# Patient Record
Sex: Male | Born: 1935 | Race: White | Hispanic: No | Smoking: Never smoker
Health system: Southern US, Community
[De-identification: ages and names within clinical notes are randomized; demographics above are authoritative.]

## PROBLEM LIST (undated history)

## (undated) DIAGNOSIS — C801 Malignant (primary) neoplasm, unspecified: Secondary | ICD-10-CM

## (undated) HISTORY — PX: HERNIA REPAIR: SHX51

---

## 2002-09-29 HISTORY — PX: BACK SURGERY: SHX140

## 2004-07-16 ENCOUNTER — Encounter: Admission: RE | Admit: 2004-07-16 | Discharge: 2004-07-16 | Payer: Self-pay | Admitting: Neurosurgery

## 2004-08-05 ENCOUNTER — Encounter: Admission: RE | Admit: 2004-08-05 | Discharge: 2004-08-05 | Payer: Self-pay | Admitting: Neurosurgery

## 2004-08-20 ENCOUNTER — Encounter: Admission: RE | Admit: 2004-08-20 | Discharge: 2004-08-20 | Payer: Self-pay | Admitting: Neurosurgery

## 2004-09-03 ENCOUNTER — Encounter: Admission: RE | Admit: 2004-09-03 | Discharge: 2004-09-03 | Payer: Self-pay | Admitting: Neurosurgery

## 2007-04-20 ENCOUNTER — Ambulatory Visit: Payer: Self-pay | Admitting: General Surgery

## 2007-05-07 ENCOUNTER — Emergency Department: Payer: Self-pay

## 2007-06-23 ENCOUNTER — Ambulatory Visit: Payer: Self-pay | Admitting: Gastroenterology

## 2008-09-29 DIAGNOSIS — C801 Malignant (primary) neoplasm, unspecified: Secondary | ICD-10-CM

## 2008-09-29 HISTORY — DX: Malignant (primary) neoplasm, unspecified: C80.1

## 2009-06-29 ENCOUNTER — Ambulatory Visit: Payer: Self-pay | Admitting: Internal Medicine

## 2009-07-26 ENCOUNTER — Emergency Department: Payer: Self-pay

## 2009-07-26 ENCOUNTER — Inpatient Hospital Stay: Payer: Self-pay | Admitting: *Deleted

## 2009-07-30 ENCOUNTER — Ambulatory Visit: Payer: Self-pay | Admitting: Internal Medicine

## 2009-07-31 ENCOUNTER — Ambulatory Visit: Payer: Self-pay | Admitting: Internal Medicine

## 2009-08-03 ENCOUNTER — Ambulatory Visit: Payer: Self-pay | Admitting: Internal Medicine

## 2009-08-06 ENCOUNTER — Ambulatory Visit: Payer: Self-pay | Admitting: Vascular Surgery

## 2009-08-14 ENCOUNTER — Ambulatory Visit: Payer: Self-pay | Admitting: Vascular Surgery

## 2009-08-29 ENCOUNTER — Ambulatory Visit: Payer: Self-pay | Admitting: Internal Medicine

## 2009-09-29 ENCOUNTER — Ambulatory Visit: Payer: Self-pay | Admitting: Internal Medicine

## 2009-10-30 ENCOUNTER — Ambulatory Visit: Payer: Self-pay | Admitting: Internal Medicine

## 2009-11-27 ENCOUNTER — Ambulatory Visit: Payer: Self-pay | Admitting: Internal Medicine

## 2009-12-25 ENCOUNTER — Ambulatory Visit: Payer: Self-pay | Admitting: Internal Medicine

## 2009-12-28 ENCOUNTER — Ambulatory Visit: Payer: Self-pay | Admitting: Internal Medicine

## 2010-01-27 ENCOUNTER — Ambulatory Visit: Payer: Self-pay | Admitting: Internal Medicine

## 2010-02-01 ENCOUNTER — Ambulatory Visit: Payer: Self-pay | Admitting: Internal Medicine

## 2010-02-27 ENCOUNTER — Ambulatory Visit: Payer: Self-pay | Admitting: Internal Medicine

## 2010-03-29 ENCOUNTER — Ambulatory Visit: Payer: Self-pay | Admitting: Internal Medicine

## 2010-03-29 HISTORY — PX: COLON SURGERY: SHX602

## 2010-04-18 LAB — CEA: CEA: 2.2 ng/mL (ref 0.0–4.7)

## 2010-04-29 ENCOUNTER — Ambulatory Visit: Payer: Self-pay | Admitting: Internal Medicine

## 2010-05-10 ENCOUNTER — Ambulatory Visit: Payer: Self-pay | Admitting: Internal Medicine

## 2010-05-16 LAB — CEA: CEA: 2.6 ng/mL (ref 0.0–4.7)

## 2010-05-21 ENCOUNTER — Inpatient Hospital Stay: Payer: Self-pay | Admitting: Internal Medicine

## 2010-05-30 ENCOUNTER — Ambulatory Visit: Payer: Self-pay | Admitting: Internal Medicine

## 2010-06-29 ENCOUNTER — Ambulatory Visit: Payer: Self-pay | Admitting: Internal Medicine

## 2010-07-12 LAB — CEA: CEA: 2.6 ng/mL (ref 0.0–4.7)

## 2010-07-19 LAB — CEA: CEA: 2.1 ng/mL (ref 0.0–4.7)

## 2010-07-30 ENCOUNTER — Ambulatory Visit: Payer: Self-pay | Admitting: Internal Medicine

## 2010-08-24 LAB — CEA: CEA: 1.1 ng/mL (ref 0.0–4.7)

## 2010-08-29 ENCOUNTER — Ambulatory Visit: Payer: Self-pay | Admitting: Internal Medicine

## 2010-09-29 ENCOUNTER — Ambulatory Visit: Payer: Self-pay | Admitting: Internal Medicine

## 2010-10-07 ENCOUNTER — Ambulatory Visit: Payer: Self-pay | Admitting: Gastroenterology

## 2010-10-09 LAB — PATHOLOGY REPORT

## 2010-10-30 ENCOUNTER — Ambulatory Visit: Payer: Self-pay | Admitting: Internal Medicine

## 2010-10-31 LAB — CEA: CEA: 1.6 ng/mL (ref 0.0–4.7)

## 2010-11-05 ENCOUNTER — Ambulatory Visit: Payer: Self-pay | Admitting: Internal Medicine

## 2010-11-28 ENCOUNTER — Ambulatory Visit: Payer: Self-pay | Admitting: Internal Medicine

## 2010-12-29 ENCOUNTER — Ambulatory Visit: Payer: Self-pay | Admitting: Internal Medicine

## 2011-01-06 ENCOUNTER — Ambulatory Visit: Payer: Self-pay | Admitting: Gastroenterology

## 2011-01-08 LAB — PATHOLOGY REPORT

## 2011-01-14 ENCOUNTER — Ambulatory Visit: Payer: Self-pay | Admitting: Internal Medicine

## 2011-01-28 ENCOUNTER — Ambulatory Visit: Payer: Self-pay | Admitting: Internal Medicine

## 2011-02-27 LAB — CEA: CEA: 1.9 ng/mL (ref 0.0–4.7)

## 2011-02-28 ENCOUNTER — Ambulatory Visit: Payer: Self-pay | Admitting: Internal Medicine

## 2011-03-30 ENCOUNTER — Ambulatory Visit: Payer: Self-pay | Admitting: Internal Medicine

## 2011-04-30 ENCOUNTER — Ambulatory Visit: Payer: Self-pay | Admitting: Internal Medicine

## 2011-04-30 HISTORY — PX: COLON SURGERY: SHX602

## 2011-05-15 LAB — CEA: CEA: 2.8 ng/mL (ref 0.0–4.7)

## 2011-05-31 ENCOUNTER — Ambulatory Visit: Payer: Self-pay | Admitting: Internal Medicine

## 2011-06-30 ENCOUNTER — Ambulatory Visit: Payer: Self-pay | Admitting: Internal Medicine

## 2011-07-31 ENCOUNTER — Ambulatory Visit: Payer: Self-pay | Admitting: Internal Medicine

## 2011-08-07 ENCOUNTER — Ambulatory Visit: Payer: Self-pay | Admitting: Surgery

## 2011-08-12 ENCOUNTER — Ambulatory Visit: Payer: Self-pay | Admitting: Internal Medicine

## 2011-08-13 ENCOUNTER — Ambulatory Visit: Payer: Self-pay | Admitting: Surgery

## 2011-08-13 LAB — CEA: CEA: 7.4 ng/mL — ABNORMAL HIGH (ref 0.0–4.7)

## 2011-08-30 ENCOUNTER — Ambulatory Visit: Payer: Self-pay | Admitting: Internal Medicine

## 2011-09-11 ENCOUNTER — Ambulatory Visit: Payer: Self-pay | Admitting: Internal Medicine

## 2011-09-12 ENCOUNTER — Ambulatory Visit: Payer: Self-pay | Admitting: Internal Medicine

## 2011-09-30 ENCOUNTER — Ambulatory Visit: Payer: Self-pay | Admitting: Internal Medicine

## 2011-10-07 LAB — CBC CANCER CENTER
Basophil %: 0.4 %
Basophil: 1 %
Eosinophil #: 0.1 x10 3/mm (ref 0.0–0.7)
Eosinophil %: 3.3 %
Eosinophil: 2 %
HCT: 39.9 % — ABNORMAL LOW (ref 40.0–52.0)
HGB: 13.7 g/dL (ref 13.0–18.0)
Lymphocyte #: 2 x10 3/mm (ref 1.0–3.6)
MCH: 31.7 pg (ref 26.0–34.0)
MCHC: 34.2 g/dL (ref 32.0–36.0)
MCV: 93 fL (ref 80–100)
Monocyte #: 0.9 x10 3/mm — ABNORMAL HIGH (ref 0.0–0.7)
Monocyte %: 24.4 %
Platelet: 136 x10 3/mm — ABNORMAL LOW (ref 150–440)
RBC: 4.3 10*6/uL — ABNORMAL LOW (ref 4.40–5.90)

## 2011-10-07 LAB — BASIC METABOLIC PANEL
Anion Gap: 8 (ref 7–16)
Calcium, Total: 8.6 mg/dL (ref 8.5–10.1)
Creatinine: 1.1 mg/dL (ref 0.60–1.30)
EGFR (African American): >60
EGFR (Non-African Amer.): >60
Glucose: 87 mg/dL (ref 65–99)
Osmolality: 283 (ref 275–301)
Potassium: 3.9 mmol/L (ref 3.5–5.1)

## 2011-10-08 LAB — CEA: CEA: 10.3 ng/mL — ABNORMAL HIGH (ref 0.0–4.7)

## 2011-10-14 LAB — CBC CANCER CENTER
Basophil #: 0 x10 3/mm (ref 0.0–0.1)
Basophil %: 0.5 %
HGB: 14.9 g/dL (ref 13.0–18.0)
Lymphocyte #: 1.9 x10 3/mm (ref 1.0–3.6)
MCH: 31.1 pg (ref 26.0–34.0)
MCV: 94 fL (ref 80–100)
Monocyte #: 0.6 x10 3/mm (ref 0.0–0.7)
Neutrophil #: 2.1 x10 3/mm (ref 1.4–6.5)
Platelet: 116 x10 3/mm — ABNORMAL LOW (ref 150–440)
RDW: 16.6 % — ABNORMAL HIGH (ref 11.5–14.5)
WBC: 4.7 x10 3/mm (ref 3.8–10.6)

## 2011-10-14 LAB — BASIC METABOLIC PANEL
Anion Gap: 11 (ref 7–16)
BUN: 9 mg/dL (ref 7–18)
Calcium, Total: 9.1 mg/dL (ref 8.5–10.1)
Chloride: 103 mmol/L (ref 98–107)
Co2: 29 mmol/L (ref 21–32)
EGFR (Non-African Amer.): >60
Glucose: 132 mg/dL — ABNORMAL HIGH (ref 65–99)
Osmolality: 286 (ref 275–301)
Potassium: 3.8 mmol/L (ref 3.5–5.1)

## 2011-10-21 LAB — CBC CANCER CENTER
Basophil #: 0 x10 3/mm (ref 0.0–0.1)
Basophil %: 0.4 %
Eosinophil #: 0.1 x10 3/mm (ref 0.0–0.7)
Eosinophil %: 1.7 %
HCT: 44.5 % (ref 40.0–52.0)
Lymphocyte %: 43.4 %
MCH: 31.9 pg (ref 26.0–34.0)
MCV: 93 fL (ref 80–100)
Monocyte %: 5.1 %
Neutrophil #: 1.6 x10 3/mm (ref 1.4–6.5)
Platelet: 78 x10 3/mm — ABNORMAL LOW (ref 150–440)
RBC: 4.79 10*6/uL (ref 4.40–5.90)
RDW: 15.6 % — ABNORMAL HIGH (ref 11.5–14.5)

## 2011-10-28 LAB — CBC CANCER CENTER
Basophil #: 0 x10 3/mm (ref 0.0–0.1)
Eosinophil #: 0.1 x10 3/mm (ref 0.0–0.7)
HCT: 42.3 % (ref 40.0–52.0)
Lymphocyte #: 1.7 x10 3/mm (ref 1.0–3.6)
Lymphocyte %: 47 %
MCHC: 34.5 g/dL (ref 32.0–36.0)
MCV: 93 fL (ref 80–100)
Neutrophil #: 1.4 x10 3/mm (ref 1.4–6.5)
Neutrophil %: 39 %
RDW: 16.2 % — ABNORMAL HIGH (ref 11.5–14.5)
WBC: 3.5 x10 3/mm — ABNORMAL LOW (ref 3.8–10.6)

## 2011-10-28 LAB — BASIC METABOLIC PANEL
Anion Gap: 10 (ref 7–16)
BUN: 8 mg/dL (ref 7–18)
Calcium, Total: 8.6 mg/dL (ref 8.5–10.1)
Chloride: 105 mmol/L (ref 98–107)
Co2: 27 mmol/L (ref 21–32)
Creatinine: 1 mg/dL (ref 0.60–1.30)
EGFR (African American): >60
Potassium: 4 mmol/L (ref 3.5–5.1)
Sodium: 142 mmol/L (ref 136–145)

## 2011-10-31 ENCOUNTER — Ambulatory Visit: Payer: Self-pay | Admitting: Internal Medicine

## 2011-11-04 LAB — CBC CANCER CENTER
Basophil #: 0 x10 3/mm (ref 0.0–0.1)
Eosinophil #: 0 x10 3/mm (ref 0.0–0.7)
Eosinophil %: 1.3 %
MCHC: 34.8 g/dL (ref 32.0–36.0)
MCV: 93 fL (ref 80–100)
Monocyte #: 0.3 x10 3/mm (ref 0.0–0.7)
Monocyte %: 10.3 %
Neutrophil #: 0.7 x10 3/mm — ABNORMAL LOW (ref 1.4–6.5)
Neutrophil %: 26.9 %
Platelet: 96 x10 3/mm — ABNORMAL LOW (ref 150–440)
RBC: 4.8 10*6/uL (ref 4.40–5.90)
WBC: 2.6 x10 3/mm — ABNORMAL LOW (ref 3.8–10.6)

## 2011-11-11 LAB — BASIC METABOLIC PANEL
Anion Gap: 8 (ref 7–16)
BUN: 10 mg/dL (ref 7–18)
Calcium, Total: 9.2 mg/dL (ref 8.5–10.1)
Chloride: 104 mmol/L (ref 98–107)
Co2: 29 mmol/L (ref 21–32)
EGFR (Non-African Amer.): >60
Osmolality: 282 (ref 275–301)
Potassium: 4.1 mmol/L (ref 3.5–5.1)

## 2011-11-11 LAB — CBC CANCER CENTER
Basophil %: 0.3 %
Eosinophil %: 0.9 %
HCT: 43.9 % (ref 40.0–52.0)
HGB: 15.3 g/dL (ref 13.0–18.0)
Lymphocyte #: 1.9 x10 3/mm (ref 1.0–3.6)
MCH: 33 pg (ref 26.0–34.0)
MCHC: 34.7 g/dL (ref 32.0–36.0)
MCV: 95 fL (ref 80–100)
Monocyte #: 0.5 x10 3/mm (ref 0.0–0.7)
Monocyte %: 14.1 %
Neutrophil #: 0.9 x10 3/mm — ABNORMAL LOW (ref 1.4–6.5)
RBC: 4.62 10*6/uL (ref 4.40–5.90)

## 2011-11-18 LAB — CBC CANCER CENTER
Basophil %: 0.8 %
Eosinophil %: 1.4 %
HCT: 43.6 % (ref 40.0–52.0)
Lymphocyte #: 1.8 x10 3/mm (ref 1.0–3.6)
MCH: 33.2 pg (ref 26.0–34.0)
MCV: 96 fL (ref 80–100)
Monocyte #: 0.4 x10 3/mm (ref 0.0–0.7)
Neutrophil #: 0.4 x10 3/mm — ABNORMAL LOW (ref 1.4–6.5)
RBC: 4.54 10*6/uL (ref 4.40–5.90)
WBC: 2.7 x10 3/mm — ABNORMAL LOW (ref 3.8–10.6)

## 2011-11-18 LAB — BASIC METABOLIC PANEL
Anion Gap: 9 (ref 7–16)
BUN: 10 mg/dL (ref 7–18)
Calcium, Total: 8.7 mg/dL (ref 8.5–10.1)
Chloride: 103 mmol/L (ref 98–107)
EGFR (African American): >60
EGFR (Non-African Amer.): >60
Glucose: 123 mg/dL — ABNORMAL HIGH (ref 65–99)
Osmolality: 284 (ref 275–301)
Potassium: 4 mmol/L (ref 3.5–5.1)

## 2011-11-18 LAB — MAGNESIUM: Magnesium: 1 mg/dL — ABNORMAL LOW

## 2011-11-28 ENCOUNTER — Ambulatory Visit: Payer: Self-pay | Admitting: Internal Medicine

## 2011-12-02 LAB — COMPREHENSIVE METABOLIC PANEL
Albumin: 3.9 g/dL (ref 3.4–5.0)
Alkaline Phosphatase: 122 U/L (ref 50–136)
Anion Gap: 10 (ref 7–16)
BUN: 12 mg/dL (ref 7–18)
Creatinine: 0.96 mg/dL (ref 0.60–1.30)
Glucose: 107 mg/dL — ABNORMAL HIGH (ref 65–99)
Osmolality: 285 (ref 275–301)
Potassium: 4.3 mmol/L (ref 3.5–5.1)
Sodium: 143 mmol/L (ref 136–145)
Total Protein: 7.4 g/dL (ref 6.4–8.2)

## 2011-12-02 LAB — CBC CANCER CENTER
Basophil %: 0.4 %
Eosinophil #: 0 x10 3/mm (ref 0.0–0.7)
HCT: 45.3 % (ref 40.0–52.0)
HGB: 15.4 g/dL (ref 13.0–18.0)
Lymphocyte %: 23.2 %
MCH: 33.2 pg (ref 26.0–34.0)
MCHC: 34 g/dL (ref 32.0–36.0)
MCV: 98 fL (ref 80–100)
Monocyte #: 0.4 x10 3/mm (ref 0.0–0.7)
Monocyte %: 5.5 %
Neutrophil #: 4.8 x10 3/mm (ref 1.4–6.5)

## 2011-12-09 LAB — BASIC METABOLIC PANEL
Anion Gap: 11 (ref 7–16)
BUN: 14 mg/dL (ref 7–18)
Chloride: 103 mmol/L (ref 98–107)
Co2: 28 mmol/L (ref 21–32)
Creatinine: 1.02 mg/dL (ref 0.60–1.30)
EGFR (African American): >60
Osmolality: 284 (ref 275–301)
Sodium: 142 mmol/L (ref 136–145)

## 2011-12-09 LAB — CBC CANCER CENTER
Eosinophil %: 1.2 %
HGB: 14.5 g/dL (ref 13.0–18.0)
Lymphocyte %: 37.2 %
Monocyte #: 0.3 x10 3/mm (ref 0.0–0.7)
Monocyte %: 7 %
Neutrophil %: 54.3 %
Platelet: 106 x10 3/mm — ABNORMAL LOW (ref 150–440)
RDW: 18.8 % — ABNORMAL HIGH (ref 11.5–14.5)
WBC: 4.1 x10 3/mm (ref 3.8–10.6)

## 2011-12-09 LAB — MAGNESIUM: Magnesium: 1 mg/dL — ABNORMAL LOW

## 2011-12-16 LAB — CBC CANCER CENTER
Eosinophil %: 2.2 %
HGB: 14.2 g/dL (ref 13.0–18.0)
Lymphocyte #: 1.4 x10 3/mm (ref 1.0–3.6)
Lymphocyte %: 46.5 %
MCH: 34.3 pg — ABNORMAL HIGH (ref 26.0–34.0)
Monocyte %: 16.6 %
Neutrophil #: 1 x10 3/mm — ABNORMAL LOW (ref 1.4–6.5)
RBC: 4.13 10*6/uL — ABNORMAL LOW (ref 4.40–5.90)
RDW: 18.8 % — ABNORMAL HIGH (ref 11.5–14.5)
WBC: 3 x10 3/mm — ABNORMAL LOW (ref 3.8–10.6)

## 2011-12-16 LAB — MAGNESIUM: Magnesium: 1 mg/dL — ABNORMAL LOW

## 2011-12-17 LAB — CEA: CEA: 3.5 ng/mL (ref 0.0–4.7)

## 2011-12-23 LAB — BASIC METABOLIC PANEL
Anion Gap: 9 (ref 7–16)
BUN: 12 mg/dL (ref 7–18)
Calcium, Total: 8.6 mg/dL (ref 8.5–10.1)
Chloride: 104 mmol/L (ref 98–107)
EGFR (African American): >60
EGFR (Non-African Amer.): >60
Glucose: 128 mg/dL — ABNORMAL HIGH (ref 65–99)
Osmolality: 285 (ref 275–301)

## 2011-12-23 LAB — CBC CANCER CENTER
Basophil #: 0 x10 3/mm (ref 0.0–0.1)
Eosinophil %: 1.3 %
Lymphocyte #: 2.1 x10 3/mm (ref 1.0–3.6)
Lymphocyte %: 53 %
MCH: 34.1 pg — ABNORMAL HIGH (ref 26.0–34.0)
MCHC: 34.3 g/dL (ref 32.0–36.0)
Monocyte #: 0.7 x10 3/mm (ref 0.0–0.7)
Monocyte %: 17 %
Neutrophil %: 27.9 %
Platelet: 114 x10 3/mm — ABNORMAL LOW (ref 150–440)
RBC: 4.47 10*6/uL (ref 4.40–5.90)
RDW: 19.4 % — ABNORMAL HIGH (ref 11.5–14.5)

## 2011-12-23 LAB — MAGNESIUM: Magnesium: 0.7 mg/dL — ABNORMAL LOW

## 2011-12-29 ENCOUNTER — Ambulatory Visit: Payer: Self-pay | Admitting: Internal Medicine

## 2011-12-30 LAB — CBC CANCER CENTER
Basophil %: 0.3 %
Eosinophil %: 1 %
HCT: 42 % (ref 40.0–52.0)
Lymphocyte #: 1.3 x10 3/mm (ref 1.0–3.6)
Lymphocyte %: 38.3 %
MCH: 34.2 pg — ABNORMAL HIGH (ref 26.0–34.0)
MCV: 100 fL (ref 80–100)
Monocyte #: 0.2 x10 3/mm (ref 0.0–0.7)
Neutrophil %: 53.7 %
RBC: 4.22 10*6/uL — ABNORMAL LOW (ref 4.40–5.90)

## 2011-12-30 LAB — BASIC METABOLIC PANEL
Anion Gap: 13 (ref 7–16)
Chloride: 102 mmol/L (ref 98–107)
Co2: 26 mmol/L (ref 21–32)
EGFR (African American): >60
EGFR (Non-African Amer.): >60
Potassium: 4.5 mmol/L (ref 3.5–5.1)

## 2012-01-06 LAB — CBC CANCER CENTER
Basophil #: 0 x10 3/mm (ref 0.0–0.1)
Basophil %: 0.2 %
Eosinophil #: 0.1 x10 3/mm (ref 0.0–0.7)
Eosinophil %: 1.9 %
HCT: 41 % (ref 40.0–52.0)
HGB: 14.1 g/dL (ref 13.0–18.0)
Lymphocyte #: 1.6 x10 3/mm (ref 1.0–3.6)
Lymphocyte %: 49.9 %
MCH: 34.5 pg — ABNORMAL HIGH (ref 26.0–34.0)
MCV: 100 fL (ref 80–100)
Monocyte #: 0.5 x10 3/mm (ref 0.0–0.7)
Neutrophil #: 1.1 x10 3/mm — ABNORMAL LOW (ref 1.4–6.5)
Neutrophil %: 33.5 %
Platelet: 90 x10 3/mm — ABNORMAL LOW (ref 150–440)
RBC: 4.09 10*6/uL — ABNORMAL LOW (ref 4.40–5.90)
RDW: 17.4 % — ABNORMAL HIGH (ref 11.5–14.5)
WBC: 3.3 x10 3/mm — ABNORMAL LOW (ref 3.8–10.6)

## 2012-01-06 LAB — CREATININE, SERUM
Creatinine: 1.12 mg/dL (ref 0.60–1.30)
EGFR (Non-African Amer.): >60

## 2012-01-06 LAB — MAGNESIUM: Magnesium: 0.8 mg/dL — ABNORMAL LOW

## 2012-01-13 LAB — CBC CANCER CENTER
Basophil #: 0 x10 3/mm (ref 0.0–0.1)
Basophil %: 0.5 %
Eosinophil %: 1.7 %
HCT: 43.8 % (ref 40.0–52.0)
HGB: 14.6 g/dL (ref 13.0–18.0)
Lymphocyte #: 1.5 x10 3/mm (ref 1.0–3.6)
Lymphocyte %: 57.1 %
MCH: 34 pg (ref 26.0–34.0)
Monocyte %: 16.8 %
Neutrophil #: 0.6 x10 3/mm — ABNORMAL LOW (ref 1.4–6.5)
Platelet: 102 x10 3/mm — ABNORMAL LOW (ref 150–440)
RBC: 4.29 10*6/uL — ABNORMAL LOW (ref 4.40–5.90)
RDW: 17.8 % — ABNORMAL HIGH (ref 11.5–14.5)

## 2012-01-13 LAB — BASIC METABOLIC PANEL
Calcium, Total: 8.4 mg/dL — ABNORMAL LOW (ref 8.5–10.1)
Creatinine: 1.13 mg/dL (ref 0.60–1.30)
Glucose: 132 mg/dL — ABNORMAL HIGH (ref 65–99)

## 2012-01-20 LAB — CBC CANCER CENTER
Basophil %: 0.5 %
Eosinophil #: 0 x10 3/mm (ref 0.0–0.7)
Eosinophil %: 0.8 %
HGB: 15.2 g/dL (ref 13.0–18.0)
Lymphocyte #: 1.6 x10 3/mm (ref 1.0–3.6)
MCH: 34 pg (ref 26.0–34.0)
MCV: 103 fL — ABNORMAL HIGH (ref 80–100)
Monocyte %: 13 %
Neutrophil #: 1.3 x10 3/mm — ABNORMAL LOW (ref 1.4–6.5)
Neutrophil %: 38.9 %
Platelet: 103 x10 3/mm — ABNORMAL LOW (ref 150–440)
RBC: 4.48 10*6/uL (ref 4.40–5.90)
RDW: 17.4 % — ABNORMAL HIGH (ref 11.5–14.5)
WBC: 3.5 x10 3/mm — ABNORMAL LOW (ref 3.8–10.6)

## 2012-01-20 LAB — BASIC METABOLIC PANEL
Anion Gap: 10 (ref 7–16)
Calcium, Total: 8.4 mg/dL — ABNORMAL LOW (ref 8.5–10.1)
Co2: 30 mmol/L (ref 21–32)
EGFR (African American): >60
EGFR (Non-African Amer.): >60
Potassium: 4.3 mmol/L (ref 3.5–5.1)

## 2012-01-20 LAB — MAGNESIUM: Magnesium: 0.8 mg/dL — ABNORMAL LOW

## 2012-01-26 ENCOUNTER — Ambulatory Visit: Payer: Self-pay | Admitting: Oncology

## 2012-01-27 LAB — COMPREHENSIVE METABOLIC PANEL
Alkaline Phosphatase: 59 U/L (ref 50–136)
Bilirubin,Total: 0.5 mg/dL (ref 0.2–1.0)
Chloride: 106 mmol/L (ref 98–107)
Creatinine: 0.9 mg/dL (ref 0.60–1.30)
EGFR (African American): >60
Glucose: 104 mg/dL — ABNORMAL HIGH (ref 65–99)
Osmolality: 284 (ref 275–301)
Potassium: 4.5 mmol/L (ref 3.5–5.1)
SGPT (ALT): 41 U/L
Total Protein: 7.5 g/dL (ref 6.4–8.2)

## 2012-01-27 LAB — CBC CANCER CENTER
Eosinophil #: 0 x10 3/mm (ref 0.0–0.7)
HCT: 44.9 % (ref 40.0–52.0)
Lymphocyte #: 1.5 x10 3/mm (ref 1.0–3.6)
Lymphocyte %: 44.3 %
MCH: 34 pg (ref 26.0–34.0)
MCV: 103 fL — ABNORMAL HIGH (ref 80–100)

## 2012-01-27 LAB — MAGNESIUM: Magnesium: 0.7 mg/dL — ABNORMAL LOW

## 2012-01-28 ENCOUNTER — Ambulatory Visit: Payer: Self-pay | Admitting: Internal Medicine

## 2012-02-03 LAB — CBC CANCER CENTER
Basophil #: 0 x10 3/mm (ref 0.0–0.1)
Basophil %: 0.4 %
Eosinophil %: 1.2 %
HCT: 43.5 % (ref 40.0–52.0)
MCH: 34 pg (ref 26.0–34.0)
MCHC: 33.3 g/dL (ref 32.0–36.0)
MCV: 102 fL — ABNORMAL HIGH (ref 80–100)
Monocyte %: 12 %
Neutrophil #: 1.9 x10 3/mm (ref 1.4–6.5)
Neutrophil %: 47.3 %
Platelet: 129 x10 3/mm — ABNORMAL LOW (ref 150–440)
RBC: 4.26 10*6/uL — ABNORMAL LOW (ref 4.40–5.90)
RDW: 15 % — ABNORMAL HIGH (ref 11.5–14.5)
WBC: 4 x10 3/mm (ref 3.8–10.6)

## 2012-02-03 LAB — COMPREHENSIVE METABOLIC PANEL
Albumin: 3.7 g/dL (ref 3.4–5.0)
Anion Gap: 7 (ref 7–16)
BUN: 14 mg/dL (ref 7–18)
EGFR (African American): >60
EGFR (Non-African Amer.): >60
Glucose: 76 mg/dL (ref 65–99)
Osmolality: 286 (ref 275–301)
SGOT(AST): 30 U/L (ref 15–37)
Sodium: 144 mmol/L (ref 136–145)

## 2012-02-03 LAB — MAGNESIUM: Magnesium: 0.8 mg/dL — ABNORMAL LOW

## 2012-02-16 LAB — CBC CANCER CENTER
Basophil %: 0.3 %
Eosinophil #: 0 x10 3/mm (ref 0.0–0.7)
HGB: 13.7 g/dL (ref 13.0–18.0)
Lymphocyte %: 39.7 %
MCH: 33 pg (ref 26.0–34.0)
MCHC: 32.8 g/dL (ref 32.0–36.0)
MCV: 101 fL — ABNORMAL HIGH (ref 80–100)
Monocyte #: 0.7 x10 3/mm (ref 0.2–1.0)
Monocyte %: 9.3 %
Neutrophil #: 3.6 x10 3/mm (ref 1.4–6.5)
Platelet: 57 x10 3/mm — ABNORMAL LOW (ref 150–440)
RBC: 4.16 10*6/uL — ABNORMAL LOW (ref 4.40–5.90)
WBC: 7.2 x10 3/mm (ref 3.8–10.6)

## 2012-02-16 LAB — MAGNESIUM: Magnesium: 0.8 mg/dL — ABNORMAL LOW

## 2012-02-28 ENCOUNTER — Ambulatory Visit: Payer: Self-pay | Admitting: Internal Medicine

## 2012-03-01 LAB — CBC CANCER CENTER
Basophil #: 0 x10 3/mm (ref 0.0–0.1)
Eosinophil #: 0 x10 3/mm (ref 0.0–0.7)
Eosinophil %: 1 %
Lymphocyte #: 1.8 x10 3/mm (ref 1.0–3.6)
Lymphocyte %: 40.7 %
MCH: 33.8 pg (ref 26.0–34.0)
MCHC: 34 g/dL (ref 32.0–36.0)
MCV: 99 fL (ref 80–100)
Monocyte #: 0.3 x10 3/mm (ref 0.2–1.0)
Monocyte %: 7.3 %
Neutrophil %: 50.5 %
Platelet: 118 x10 3/mm — ABNORMAL LOW (ref 150–440)
RBC: 4.54 10*6/uL (ref 4.40–5.90)

## 2012-03-01 LAB — COMPREHENSIVE METABOLIC PANEL
Albumin: 4.2 g/dL (ref 3.4–5.0)
Anion Gap: 9 (ref 7–16)
Co2: 28 mmol/L (ref 21–32)
EGFR (Non-African Amer.): >60
Glucose: 126 mg/dL — ABNORMAL HIGH (ref 65–99)
Osmolality: 283 (ref 275–301)
Potassium: 4.3 mmol/L (ref 3.5–5.1)
SGOT(AST): 44 U/L — ABNORMAL HIGH (ref 15–37)
SGPT (ALT): 46 U/L
Total Protein: 7.6 g/dL (ref 6.4–8.2)

## 2012-03-02 LAB — CEA: CEA: 2.6 ng/mL (ref 0.0–4.7)

## 2012-03-15 LAB — CBC CANCER CENTER
Basophil #: 0 x10 3/mm (ref 0.0–0.1)
Basophil %: 0.1 %
HCT: 45.8 % (ref 40.0–52.0)
HGB: 15.7 g/dL (ref 13.0–18.0)
Lymphocyte #: 2.4 x10 3/mm (ref 1.0–3.6)
Lymphocyte %: 27.5 %
MCV: 98 fL (ref 80–100)
Monocyte #: 0.8 x10 3/mm (ref 0.2–1.0)
Monocyte %: 8.6 %
Neutrophil #: 5.7 x10 3/mm (ref 1.4–6.5)
RDW: 13.5 % (ref 11.5–14.5)

## 2012-03-15 LAB — COMPREHENSIVE METABOLIC PANEL
Albumin: 4.1 g/dL (ref 3.4–5.0)
Alkaline Phosphatase: 64 U/L (ref 50–136)
BUN: 14 mg/dL (ref 7–18)
Calcium, Total: 9.2 mg/dL (ref 8.5–10.1)
Chloride: 104 mmol/L (ref 98–107)
SGOT(AST): 40 U/L — ABNORMAL HIGH (ref 15–37)
SGPT (ALT): 35 U/L
Sodium: 140 mmol/L (ref 136–145)
Total Protein: 7.7 g/dL (ref 6.4–8.2)

## 2012-03-15 LAB — MAGNESIUM: Magnesium: 0.9 mg/dL — ABNORMAL LOW

## 2012-03-29 ENCOUNTER — Ambulatory Visit: Payer: Self-pay | Admitting: Internal Medicine

## 2012-03-29 LAB — CBC CANCER CENTER
Basophil %: 0.3 %
HCT: 42.9 % (ref 40.0–52.0)
MCHC: 33.5 g/dL (ref 32.0–36.0)
MCV: 98 fL (ref 80–100)
Neutrophil %: 49.8 %
RDW: 14 % (ref 11.5–14.5)

## 2012-03-29 LAB — MAGNESIUM: Magnesium: 1.1 mg/dL — ABNORMAL LOW

## 2012-03-29 LAB — COMPREHENSIVE METABOLIC PANEL
Alkaline Phosphatase: 53 U/L (ref 50–136)
Anion Gap: 9 (ref 7–16)
BUN: 13 mg/dL (ref 7–18)
Bilirubin,Total: 0.5 mg/dL (ref 0.2–1.0)
Calcium, Total: 8.9 mg/dL (ref 8.5–10.1)
Chloride: 104 mmol/L (ref 98–107)
Co2: 29 mmol/L (ref 21–32)
Creatinine: 1.06 mg/dL (ref 0.60–1.30)
EGFR (Non-African Amer.): >60
Glucose: 91 mg/dL (ref 65–99)
Osmolality: 283 (ref 275–301)
Potassium: 4.7 mmol/L (ref 3.5–5.1)
Sodium: 142 mmol/L (ref 136–145)
Total Protein: 7.4 g/dL (ref 6.4–8.2)

## 2012-03-30 LAB — CEA: CEA: 3 ng/mL (ref 0.0–4.7)

## 2012-03-31 ENCOUNTER — Ambulatory Visit: Payer: Self-pay | Admitting: Surgery

## 2012-04-08 ENCOUNTER — Ambulatory Visit: Payer: Self-pay | Admitting: Surgery

## 2012-04-08 LAB — CBC
HCT: 42 % (ref 40.0–52.0)
HGB: 14.4 g/dL (ref 13.0–18.0)
MCH: 33.3 pg (ref 26.0–34.0)
MCV: 97 fL (ref 80–100)
RBC: 4.34 10*6/uL — ABNORMAL LOW (ref 4.40–5.90)
RDW: 14 % (ref 11.5–14.5)
WBC: 6.3 10*3/uL (ref 3.8–10.6)

## 2012-04-08 LAB — BASIC METABOLIC PANEL
BUN: 16 mg/dL (ref 7–18)
Calcium, Total: 8.8 mg/dL (ref 8.5–10.1)
EGFR (African American): >60
EGFR (Non-African Amer.): >60
Glucose: 85 mg/dL (ref 65–99)
Osmolality: 280 (ref 275–301)
Potassium: 5.5 mmol/L — ABNORMAL HIGH (ref 3.5–5.1)
Sodium: 140 mmol/L (ref 136–145)

## 2012-04-08 LAB — MAGNESIUM: Magnesium: 0.9 mg/dL — ABNORMAL LOW

## 2012-04-12 LAB — CBC CANCER CENTER
Basophil %: 0.2 %
Eosinophil #: 0 x10 3/mm (ref 0.0–0.7)
Eosinophil %: 0.6 %
HCT: 44.7 % (ref 40.0–52.0)
HGB: 14.8 g/dL (ref 13.0–18.0)
Lymphocyte %: 29.1 %
Monocyte #: 0.6 x10 3/mm (ref 0.2–1.0)
Monocyte %: 7.9 %
Neutrophil #: 4.5 x10 3/mm (ref 1.4–6.5)
Neutrophil %: 62.2 %
Platelet: 133 x10 3/mm — ABNORMAL LOW (ref 150–440)
RBC: 4.6 10*6/uL (ref 4.40–5.90)
WBC: 7.2 x10 3/mm (ref 3.8–10.6)

## 2012-04-12 LAB — COMPREHENSIVE METABOLIC PANEL
Alkaline Phosphatase: 59 U/L (ref 50–136)
BUN: 13 mg/dL (ref 7–18)
Bilirubin,Total: 0.5 mg/dL (ref 0.2–1.0)
Chloride: 104 mmol/L (ref 98–107)
Co2: 23 mmol/L (ref 21–32)
Creatinine: 1.19 mg/dL (ref 0.60–1.30)
EGFR (Non-African Amer.): 58 — ABNORMAL LOW
Glucose: 118 mg/dL — ABNORMAL HIGH (ref 65–99)
Osmolality: 281 (ref 275–301)
SGPT (ALT): 47 U/L
Sodium: 140 mmol/L (ref 136–145)
Total Protein: 7.5 g/dL (ref 6.4–8.2)

## 2012-04-12 LAB — MAGNESIUM: Magnesium: 1.3 mg/dL — ABNORMAL LOW

## 2012-04-13 LAB — CEA: CEA: 2.8 ng/mL (ref 0.0–4.7)

## 2012-04-16 ENCOUNTER — Inpatient Hospital Stay: Payer: Self-pay | Admitting: Surgery

## 2012-04-18 LAB — PLATELET COUNT: Platelet: 111 10*3/uL — ABNORMAL LOW (ref 150–440)

## 2012-04-26 LAB — CBC CANCER CENTER
Basophil #: 0 x10 3/mm (ref 0.0–0.1)
Basophil %: 0.6 %
Eosinophil #: 0.1 x10 3/mm (ref 0.0–0.7)
Lymphocyte %: 32.6 %
MCHC: 35 g/dL (ref 32.0–36.0)
Monocyte #: 0.8 x10 3/mm (ref 0.2–1.0)
Monocyte %: 11.2 %
Neutrophil #: 3.9 x10 3/mm (ref 1.4–6.5)
Neutrophil %: 54.6 %
RDW: 13.9 % (ref 11.5–14.5)

## 2012-04-26 LAB — COMPREHENSIVE METABOLIC PANEL
Alkaline Phosphatase: 74 U/L (ref 50–136)
BUN: 14 mg/dL (ref 7–18)
Bilirubin,Total: 0.5 mg/dL (ref 0.2–1.0)
Chloride: 102 mmol/L (ref 98–107)
Co2: 26 mmol/L (ref 21–32)
Creatinine: 1.2 mg/dL (ref 0.60–1.30)
EGFR (Non-African Amer.): 58 — ABNORMAL LOW
Glucose: 113 mg/dL — ABNORMAL HIGH (ref 65–99)
Osmolality: 275 (ref 275–301)
SGOT(AST): 35 U/L (ref 15–37)
Sodium: 137 mmol/L (ref 136–145)
Total Protein: 7.1 g/dL (ref 6.4–8.2)

## 2012-04-26 LAB — MAGNESIUM: Magnesium: 1 mg/dL — ABNORMAL LOW

## 2012-04-29 ENCOUNTER — Ambulatory Visit: Payer: Self-pay | Admitting: Oncology

## 2012-05-10 LAB — CBC CANCER CENTER
Basophil #: 0 x10 3/mm (ref 0.0–0.1)
Basophil %: 0.2 %
Eosinophil #: 0.1 x10 3/mm (ref 0.0–0.7)
HCT: 43.8 % (ref 40.0–52.0)
HGB: 15.3 g/dL (ref 13.0–18.0)
Lymphocyte #: 2.4 x10 3/mm (ref 1.0–3.6)
MCH: 33.2 pg (ref 26.0–34.0)
MCHC: 34.9 g/dL (ref 32.0–36.0)
MCV: 95 fL (ref 80–100)
Monocyte #: 0.6 x10 3/mm (ref 0.2–1.0)
Monocyte %: 7.7 %
Neutrophil #: 4.7 x10 3/mm (ref 1.4–6.5)
RBC: 4.59 10*6/uL (ref 4.40–5.90)
RDW: 14 % (ref 11.5–14.5)

## 2012-05-10 LAB — COMPREHENSIVE METABOLIC PANEL
Albumin: 4.1 g/dL (ref 3.4–5.0)
Anion Gap: 5 — ABNORMAL LOW (ref 7–16)
BUN: 17 mg/dL (ref 7–18)
Bilirubin,Total: 0.6 mg/dL (ref 0.2–1.0)
Calcium, Total: 8.9 mg/dL (ref 8.5–10.1)
Chloride: 104 mmol/L (ref 98–107)
Co2: 31 mmol/L (ref 21–32)
EGFR (African American): >60
SGOT(AST): 39 U/L — ABNORMAL HIGH (ref 15–37)
SGPT (ALT): 38 U/L (ref 12–78)
Sodium: 140 mmol/L (ref 136–145)
Total Protein: 7.6 g/dL (ref 6.4–8.2)

## 2012-05-10 LAB — MAGNESIUM: Magnesium: 0.9 mg/dL — ABNORMAL LOW

## 2012-05-24 LAB — CBC CANCER CENTER
Basophil %: 0.3 %
Eosinophil #: 0.1 x10 3/mm (ref 0.0–0.7)
HCT: 45.6 % (ref 40.0–52.0)
HGB: 15.1 g/dL (ref 13.0–18.0)
Lymphocyte %: 26.2 %
MCHC: 33.2 g/dL (ref 32.0–36.0)
MCV: 96 fL (ref 80–100)
Monocyte %: 7.9 %
Neutrophil #: 4.4 x10 3/mm (ref 1.4–6.5)
RDW: 14.4 % (ref 11.5–14.5)
WBC: 6.8 x10 3/mm (ref 3.8–10.6)

## 2012-05-24 LAB — COMPREHENSIVE METABOLIC PANEL
Alkaline Phosphatase: 68 U/L (ref 50–136)
Anion Gap: 6 — ABNORMAL LOW (ref 7–16)
BUN: 15 mg/dL (ref 7–18)
Bilirubin,Total: 0.5 mg/dL (ref 0.2–1.0)
Chloride: 103 mmol/L (ref 98–107)
Creatinine: 1.07 mg/dL (ref 0.60–1.30)
Glucose: 100 mg/dL — ABNORMAL HIGH (ref 65–99)
Osmolality: 280 (ref 275–301)
Potassium: 4.2 mmol/L (ref 3.5–5.1)
SGPT (ALT): 44 U/L (ref 12–78)
Sodium: 140 mmol/L (ref 136–145)
Total Protein: 7.8 g/dL (ref 6.4–8.2)

## 2012-05-24 LAB — MAGNESIUM: Magnesium: 0.9 mg/dL — ABNORMAL LOW

## 2012-05-25 LAB — CEA: CEA: 2.8 ng/mL (ref 0.0–4.7)

## 2012-05-30 ENCOUNTER — Ambulatory Visit: Payer: Self-pay | Admitting: Internal Medicine

## 2012-05-30 ENCOUNTER — Ambulatory Visit: Payer: Self-pay | Admitting: Oncology

## 2012-06-07 LAB — CBC CANCER CENTER
Basophil #: 0 x10 3/mm (ref 0.0–0.1)
Basophil %: 0.3 %
Eosinophil #: 0.1 x10 3/mm (ref 0.0–0.7)
HCT: 43.6 % (ref 40.0–52.0)
HGB: 14.4 g/dL (ref 13.0–18.0)
Lymphocyte #: 2.4 x10 3/mm (ref 1.0–3.6)
MCHC: 33.1 g/dL (ref 32.0–36.0)
MCV: 97 fL (ref 80–100)
Neutrophil #: 4 x10 3/mm (ref 1.4–6.5)
RDW: 14.5 % (ref 11.5–14.5)

## 2012-06-07 LAB — COMPREHENSIVE METABOLIC PANEL
Albumin: 4.2 g/dL (ref 3.4–5.0)
Anion Gap: 5 — ABNORMAL LOW (ref 7–16)
Bilirubin,Total: 0.7 mg/dL (ref 0.2–1.0)
Chloride: 105 mmol/L (ref 98–107)
Co2: 30 mmol/L (ref 21–32)
Creatinine: 1.06 mg/dL (ref 0.60–1.30)
EGFR (African American): >60
EGFR (Non-African Amer.): >60
Glucose: 104 mg/dL — ABNORMAL HIGH (ref 65–99)
Osmolality: 280 (ref 275–301)
Potassium: 4.2 mmol/L (ref 3.5–5.1)
SGOT(AST): 36 U/L (ref 15–37)
Sodium: 140 mmol/L (ref 136–145)

## 2012-06-07 LAB — MAGNESIUM: Magnesium: 0.8 mg/dL — ABNORMAL LOW

## 2012-06-21 LAB — CBC CANCER CENTER
Basophil #: 0 x10 3/mm (ref 0.0–0.1)
Eosinophil #: 0.1 x10 3/mm (ref 0.0–0.7)
Eosinophil %: 1.1 %
HGB: 15.4 g/dL (ref 13.0–18.0)
MCHC: 32.8 g/dL (ref 32.0–36.0)
MCV: 97 fL (ref 80–100)
Monocyte #: 0.4 x10 3/mm (ref 0.2–1.0)
Monocyte %: 8.7 %
Neutrophil %: 50.9 %
Platelet: 120 x10 3/mm — ABNORMAL LOW (ref 150–440)
RBC: 4.87 10*6/uL (ref 4.40–5.90)
WBC: 4.9 x10 3/mm (ref 3.8–10.6)

## 2012-06-21 LAB — COMPREHENSIVE METABOLIC PANEL
Albumin: 4.1 g/dL (ref 3.4–5.0)
Anion Gap: 6 — ABNORMAL LOW (ref 7–16)
BUN: 16 mg/dL (ref 7–18)
Bilirubin,Total: 0.6 mg/dL (ref 0.2–1.0)
Creatinine: 1.21 mg/dL (ref 0.60–1.30)
EGFR (African American): >60
Glucose: 133 mg/dL — ABNORMAL HIGH (ref 65–99)
Potassium: 3.8 mmol/L (ref 3.5–5.1)
Sodium: 139 mmol/L (ref 136–145)
Total Protein: 7.6 g/dL (ref 6.4–8.2)

## 2012-06-21 LAB — MAGNESIUM: Magnesium: 1 mg/dL — ABNORMAL LOW

## 2012-06-22 LAB — CEA: CEA: 3.8 ng/mL (ref 0.0–4.7)

## 2012-06-29 ENCOUNTER — Ambulatory Visit: Payer: Self-pay | Admitting: Oncology

## 2012-07-05 LAB — COMPREHENSIVE METABOLIC PANEL
Alkaline Phosphatase: 58 U/L (ref 50–136)
Anion Gap: 13 (ref 7–16)
BUN: 11 mg/dL (ref 7–18)
Calcium, Total: 8.7 mg/dL (ref 8.5–10.1)
Creatinine: 1.06 mg/dL (ref 0.60–1.30)
EGFR (Non-African Amer.): >60
Glucose: 127 mg/dL — ABNORMAL HIGH (ref 65–99)
Osmolality: 286 (ref 275–301)
SGPT (ALT): 46 U/L (ref 12–78)

## 2012-07-05 LAB — CBC CANCER CENTER
Basophil %: 0.4 %
Eosinophil #: 0.1 x10 3/mm (ref 0.0–0.7)
HGB: 15.1 g/dL (ref 13.0–18.0)
MCH: 31.8 pg (ref 26.0–34.0)
MCHC: 33.3 g/dL (ref 32.0–36.0)
MCV: 96 fL (ref 80–100)
Monocyte %: 6.4 %
Neutrophil #: 3.4 x10 3/mm (ref 1.4–6.5)
Neutrophil %: 58 %
RBC: 4.76 10*6/uL (ref 4.40–5.90)

## 2012-07-30 ENCOUNTER — Ambulatory Visit: Payer: Self-pay | Admitting: Oncology

## 2012-08-04 ENCOUNTER — Ambulatory Visit: Payer: Self-pay | Admitting: Oncology

## 2012-08-09 ENCOUNTER — Ambulatory Visit: Payer: Self-pay | Admitting: Oncology

## 2012-08-09 LAB — COMPREHENSIVE METABOLIC PANEL
Alkaline Phosphatase: 75 U/L (ref 50–136)
Anion Gap: 12 (ref 7–16)
BUN: 13 mg/dL (ref 7–18)
Calcium, Total: 8.6 mg/dL (ref 8.5–10.1)
Chloride: 104 mmol/L (ref 98–107)
Creatinine: 1.14 mg/dL (ref 0.60–1.30)
EGFR (African American): >60
EGFR (Non-African Amer.): >60
Glucose: 95 mg/dL (ref 65–99)
Osmolality: 287 (ref 275–301)
Potassium: 4 mmol/L (ref 3.5–5.1)
SGOT(AST): 30 U/L (ref 15–37)
SGPT (ALT): 38 U/L (ref 12–78)

## 2012-08-09 LAB — CBC CANCER CENTER
Basophil #: 0 10*3/uL
Basophil %: 0.4 %
Eosinophil #: 0.1 10*3/uL
Eosinophil %: 1.4 %
HCT: 45.5 %
HGB: 14.9 g/dL
Lymphocyte %: 30.8 %
Lymphs Abs: 1.9 10*3/uL
MCH: 31.4 pg
MCHC: 32.8 g/dL
MCV: 96 fL
Monocyte #: 0.6 10*3/uL
Monocyte %: 10 %
Neutrophil #: 3.6 10*3/uL
Neutrophil %: 57.4 %
Platelet: 152 10*3/uL
RBC: 4.75 10*6/uL
RDW: 14.3 %
WBC: 6.3 10*3/uL

## 2012-08-09 LAB — MAGNESIUM: Magnesium: 0.8 mg/dL — ABNORMAL LOW

## 2012-08-10 LAB — CEA: CEA: 4 ng/mL (ref 0.0–4.7)

## 2012-08-23 LAB — CBC CANCER CENTER
Basophil %: 0.5 %
Lymphocyte #: 1.8 x10 3/mm (ref 1.0–3.6)
Lymphocyte %: 33.1 %
MCV: 96 fL (ref 80–100)
Monocyte #: 0.4 x10 3/mm (ref 0.2–1.0)
Monocyte %: 7.9 %
Neutrophil %: 57 %
Platelet: 142 x10 3/mm — ABNORMAL LOW (ref 150–440)
RDW: 14.1 % (ref 11.5–14.5)
WBC: 5.4 x10 3/mm (ref 3.8–10.6)

## 2012-08-23 LAB — COMPREHENSIVE METABOLIC PANEL
Anion Gap: 11 (ref 7–16)
Calcium, Total: 8.6 mg/dL (ref 8.5–10.1)
Chloride: 102 mmol/L (ref 98–107)
Co2: 27 mmol/L (ref 21–32)
EGFR (African American): >60
EGFR (Non-African Amer.): >60
Glucose: 121 mg/dL — ABNORMAL HIGH (ref 65–99)
Osmolality: 281 (ref 275–301)
Potassium: 4.1 mmol/L (ref 3.5–5.1)
Sodium: 140 mmol/L (ref 136–145)

## 2012-08-24 LAB — CEA: CEA: 3.9 ng/mL (ref 0.0–4.7)

## 2012-08-29 ENCOUNTER — Ambulatory Visit: Payer: Self-pay | Admitting: Internal Medicine

## 2012-09-01 ENCOUNTER — Other Ambulatory Visit: Payer: Self-pay | Admitting: Oncology

## 2012-09-01 DIAGNOSIS — R918 Other nonspecific abnormal finding of lung field: Secondary | ICD-10-CM

## 2012-09-06 LAB — COMPREHENSIVE METABOLIC PANEL
Albumin: 3.8 g/dL (ref 3.4–5.0)
Anion Gap: 12 (ref 7–16)
BUN: 12 mg/dL (ref 7–18)
Bilirubin,Total: 0.6 mg/dL (ref 0.2–1.0)
Chloride: 103 mmol/L (ref 98–107)
Co2: 26 mmol/L (ref 21–32)
Creatinine: 1.25 mg/dL (ref 0.60–1.30)
EGFR (African American): >60
EGFR (Non-African Amer.): 54 — ABNORMAL LOW
Osmolality: 283 (ref 275–301)
Potassium: 3.9 mmol/L (ref 3.5–5.1)
SGOT(AST): 29 U/L (ref 15–37)
Sodium: 141 mmol/L (ref 136–145)
Total Protein: 6.9 g/dL (ref 6.4–8.2)

## 2012-09-06 LAB — CBC CANCER CENTER
Basophil #: 0 x10 3/mm (ref 0.0–0.1)
Basophil %: 0.5 %
HGB: 14 g/dL (ref 13.0–18.0)
Lymphocyte %: 38.3 %
MCV: 95 fL (ref 80–100)
Monocyte %: 7.4 %
Neutrophil %: 52.1 %
Platelet: 120 x10 3/mm — ABNORMAL LOW (ref 150–440)
RDW: 14.7 % — ABNORMAL HIGH (ref 11.5–14.5)
WBC: 6 x10 3/mm (ref 3.8–10.6)

## 2012-09-09 ENCOUNTER — Ambulatory Visit
Admission: RE | Admit: 2012-09-09 | Discharge: 2012-09-09 | Disposition: A | Discharge status: Home / Self Care | Payer: Medicare Other | Source: Ambulatory Visit | Attending: Oncology | Admitting: Oncology

## 2012-09-09 DIAGNOSIS — R918 Other nonspecific abnormal finding of lung field: Secondary | ICD-10-CM

## 2012-09-09 HISTORY — DX: Malignant (primary) neoplasm, unspecified: C80.1

## 2012-09-14 NOTE — Progress Notes (Signed)
Patient ID: Edward Bradley, male   DOB: 07-06-1931, 78 y.o.   MRN: [id number]  NEW PATIENT OFFICE VISIT  September 09, 2012  [name], M.D. [facility] [address] [place], [place] [address]  RE: Edward Bradley (DOB: 1931-05-21)  Dear Dr. [name]:  Thank you for sending Mr. Edward Bradley for consultation regarding possible use of percutaneous ablation to treat metastatic pulmonary lesions. The patient is a 78 year old male with a history of stage IV rectal carcinoma originally diagnosed three years ago and is status post surgical resection and chemotherapy. The patient initially presented with multiple hepatic metastases, pulmonary nodules and positive lymph nodes with a CEA level of 14.3. He was treated with six cycles of FOLFOX and Cetuximab with significant response. Hepatic metastatic disease resolved by imaging and CEA dropped to 1.1. A colostomy take down failed due to rectal stricture and the patient has a chronic left sided colostomy.  Due to rising CEA level up to 4.0, the patient was restaged with PET scan on 08/04/12 demonstrating a new hypermetabolic metastasis measuring 2 cm in the left lower lobe demonstrating an SUV max of 10.4 and a 1.1 cm right lower lobe pulmonary nodule demonstrating an SUV max of 2.5. There was no other evidence of recurrent or metastatic disease throughout the rest of the body.  The patient was referred to Dr. [name] for opinion regarding surgical resection of metastatic lesions. Based on bilateral lesions and the patient's age, he was not felt to be the best candidate for surgical resection.  Mr. [name] has been asymptomatic with respect to the pulmonary lesions and denies any hemoptysis, chest pain or significant shortness of breath. He was restarted on chemotherapy in November and is now on a regimen of Vectibix and Folfiri.  Past Medical History:  1. Metastatic colon cancer as above.  Recent chemotherapy has been complicated by hypomagnesemia and the patient is receiving magnesium supplements. 2. History of high cholesterol. The patient's primary care physician is Dr. [name]. 3. History of prior spinal surgery in 2004. 4. History of prior hernia repair.  Medications: Tamsulosin 0.4 mg daily, doxycycline 100 mg daily, Zofran 4 mg as needed for nausea, simvastatin 40 mg daily, CoQ10 50 mg daily, multivitamin daily, vitamin D3 supplement daily.  Allergies: Sulfa.  Social History: The patient is single and lives in [place], [place]. He has four children. He is not currently employed. He drinks wine with dinner. He denies tobacco use.  Family History: Father, brother and sister with history of asthma. Sister with history of breast cancer. Father with history of stroke. Multiple family members with history of diabetes.  Review of Systems: General: Chronic fatigue from chemotherapy. Ear, nose, throat: Periodic ringing in ears. Eyes: History of cataracts. Gastrointestinal: No vomiting or diarrhea. Occasional nausea related to chemotherapy. Genitourinary: History of enlarged prostate gland without prior prostate surgery or biopsy. No urinary symptoms. Cardiovascular: No chest pain or palpitations. Musculoskeletal: No significant joint or muscle pain. 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 Respiratory: Some mild dyspnea with exertion. No hemoptysis or cough. Skin: History of rash and skin lesions related to chemotherapy. The patient is on chronic doxycycline.  Exam: Vital Signs: Blood pressure 119/79, pulse 88, respirations 18, temperature 97.6, oxygen saturation 98 percent on room air. Height 5'6", 143 pounds. General: No acute distress. Chest: Clear to auscultation bilaterally. Heart: Regular rate and rhythm. Abdomen: Soft and nontender. Extremities: No edema.  Labs: BUN 13, creatinine 1.14, WBC  5.4, hemoglobin  14.9, hematocrit 44.6, platelet count 142, CEA 3.9 on 08/23/12.  Imaging: I personally reviewed the latest PET scan dated 08/04/12 on CD from [facility]. The 2 cm left lower lobe nodule is located in the posterior lower lobe with approximately 1 cm margin from the nearest posterior pleural surface. This lesion is considerably more hypermetabolic than the smaller 1.1 cm right lower lobe nodule which is located in the infrahilar region of the lower lobe immediately posterior and medial to a posterior basilar segment bronchus.  Assessment: I met with [name] and his significant other and reviewed the PET CT findings with them as well as treatment options. He is currently tolerating the latest chemotherapy regimen well without significant complications. He has not been reimaged since restarting chemotherapy. I reviewed percutaneous treatment options with the patient including percutaneous microwave ablation. We also discussed the possibility of future stereotactic radiation therapy. Both lesions are potentially amenable to future thermal ablation. The smaller right lower lobe lesion is located next to a larger bronchus compared to the left lower lobe lesion and may be slightly riskier to treat percutaneously. The left lower lobe lesion is clearly more metabolic by PET scan and currently is of size and location amenable to percutaneous ablation.  Given that the patient has just restarted chemotherapy for the last three weeks, I did recommend that we obtain at least one interval follow-up CT of the chest fairly soon to determine initial treatment response of chemotherapy. This will be helpful in timing potential future ablation and determining which lesion or lesions to ablate in the future. Future ablation would also have to be timed with cessation of chemotherapy for probably approximately 4 weeks prior to ablation.  Details of percutaneous microwave ablation  were reviewed with the patient including risks of pneumothorax, pulmonary hemorrhage and bronchopleural fistula. The procedure is performed under general anesthesia and involves an overnight hospital stay. After discussion, the patient is definitely interested in pursuing ablation in the future if it is appropriate and he does not develop any other significant interval metastatic disease.  I think that it would be helpful to obtain a restaging CT of the chest in approximately one month to determine whether the lesions are responding to therapy or not. In the meantime, any precertification necessary for pulmonary ablation will be started.  Thank you for sending Mr. Eslick for consultation and allowing me to participate in his care. He would like to have a follow-up scan performed at [facility].  Sincerely,  Irish Lack, M.D. Lavone NianHulda Marin, M.D. [name], M.D.

## 2012-09-20 LAB — CBC CANCER CENTER
Basophil %: 0.7 %
Eosinophil %: 2.2 %
HCT: 40.5 % (ref 40.0–52.0)
HGB: 14.3 g/dL (ref 13.0–18.0)
Lymphocyte #: 2.3 x10 3/mm (ref 1.0–3.6)
MCHC: 35.3 g/dL (ref 32.0–36.0)
Monocyte #: 0.7 x10 3/mm (ref 0.2–1.0)
Monocyte %: 10.5 %
Neutrophil #: 3.8 x10 3/mm (ref 1.4–6.5)
Neutrophil %: 54.1 %
Platelet: 111 x10 3/mm — ABNORMAL LOW (ref 150–440)
RDW: 15.9 % — ABNORMAL HIGH (ref 11.5–14.5)
WBC: 6.9 x10 3/mm (ref 3.8–10.6)

## 2012-09-20 LAB — COMPREHENSIVE METABOLIC PANEL
Alkaline Phosphatase: 68 U/L (ref 50–136)
Anion Gap: 12 (ref 7–16)
BUN: 16 mg/dL (ref 7–18)
Bilirubin,Total: 0.7 mg/dL (ref 0.2–1.0)
Chloride: 104 mmol/L (ref 98–107)
Co2: 26 mmol/L (ref 21–32)
Creatinine: 1.22 mg/dL (ref 0.60–1.30)
EGFR (African American): >60
EGFR (Non-African Amer.): 56 — ABNORMAL LOW
Osmolality: 285 (ref 275–301)
Potassium: 3.9 mmol/L (ref 3.5–5.1)
SGPT (ALT): 36 U/L (ref 12–78)
Total Protein: 7.3 g/dL (ref 6.4–8.2)

## 2012-09-20 LAB — MAGNESIUM: Magnesium: 0.5 mg/dL — ABNORMAL LOW

## 2012-09-29 ENCOUNTER — Ambulatory Visit: Payer: Self-pay | Admitting: Oncology

## 2012-10-04 LAB — COMPREHENSIVE METABOLIC PANEL
Albumin: 3.6 g/dL (ref 3.4–5.0)
Anion Gap: 14 (ref 7–16)
Calcium, Total: 8.3 mg/dL — ABNORMAL LOW (ref 8.5–10.1)
Chloride: 103 mmol/L (ref 98–107)
Co2: 25 mmol/L (ref 21–32)
Creatinine: 1.19 mg/dL (ref 0.60–1.30)
Glucose: 138 mg/dL — ABNORMAL HIGH (ref 65–99)
Potassium: 4 mmol/L (ref 3.5–5.1)
SGOT(AST): 36 U/L (ref 15–37)
SGPT (ALT): 35 U/L (ref 12–78)
Sodium: 142 mmol/L (ref 136–145)

## 2012-10-04 LAB — CBC CANCER CENTER
Basophil #: 0.1 x10 3/mm (ref 0.0–0.1)
Basophil %: 0.9 %
HCT: 38.2 % — ABNORMAL LOW (ref 40.0–52.0)
HGB: 13.5 g/dL (ref 13.0–18.0)
Lymphocyte %: 32 %
MCH: 34.1 pg — ABNORMAL HIGH (ref 26.0–34.0)
MCHC: 35.4 g/dL (ref 32.0–36.0)
MCV: 96 fL (ref 80–100)
Platelet: 124 x10 3/mm — ABNORMAL LOW (ref 150–440)
RBC: 3.97 10*6/uL — ABNORMAL LOW (ref 4.40–5.90)
RDW: 18.5 % — ABNORMAL HIGH (ref 11.5–14.5)

## 2012-10-05 LAB — CEA: CEA: 2.3 ng/mL (ref 0.0–4.7)

## 2012-10-12 ENCOUNTER — Telehealth: Payer: Self-pay | Admitting: Radiology

## 2012-10-12 NOTE — Telephone Encounter (Signed)
Error

## 2012-10-18 LAB — CBC CANCER CENTER
Basophil #: 0 x10 3/mm (ref 0.0–0.1)
Eosinophil %: 0 %
HGB: 13.7 g/dL (ref 13.0–18.0)
Lymphocyte #: 1.2 x10 3/mm (ref 1.0–3.6)
MCH: 34.2 pg — ABNORMAL HIGH (ref 26.0–34.0)
MCHC: 34.7 g/dL (ref 32.0–36.0)
MCV: 99 fL (ref 80–100)
Monocyte %: 1.9 %
Neutrophil %: 88 %
RBC: 4.01 10*6/uL — ABNORMAL LOW (ref 4.40–5.90)
RDW: 19.9 % — ABNORMAL HIGH (ref 11.5–14.5)
WBC: 11.9 x10 3/mm — ABNORMAL HIGH (ref 3.8–10.6)

## 2012-10-18 LAB — COMPREHENSIVE METABOLIC PANEL
Albumin: 3.4 g/dL (ref 3.4–5.0)
Alkaline Phosphatase: 81 U/L (ref 50–136)
Anion Gap: 14 (ref 7–16)
Calcium, Total: 9.2 mg/dL (ref 8.5–10.1)
Chloride: 102 mmol/L (ref 98–107)
EGFR (African American): >60
EGFR (Non-African Amer.): 53 — ABNORMAL LOW
Osmolality: 278 (ref 275–301)
SGPT (ALT): 37 U/L (ref 12–78)

## 2012-10-18 LAB — MAGNESIUM: Magnesium: 1.3 mg/dL — ABNORMAL LOW

## 2012-10-19 LAB — CEA: CEA: 2.5 ng/mL (ref 0.0–4.7)

## 2012-10-26 ENCOUNTER — Other Ambulatory Visit: Payer: Self-pay | Admitting: Interventional Radiology

## 2012-10-26 DIAGNOSIS — C20 Malignant neoplasm of rectum: Secondary | ICD-10-CM

## 2012-10-26 DIAGNOSIS — C7800 Secondary malignant neoplasm of unspecified lung: Secondary | ICD-10-CM

## 2012-10-30 ENCOUNTER — Ambulatory Visit: Payer: Self-pay | Admitting: Oncology

## 2012-10-30 ENCOUNTER — Ambulatory Visit: Payer: Self-pay | Admitting: Internal Medicine

## 2012-11-01 LAB — COMPREHENSIVE METABOLIC PANEL
Albumin: 3.6 g/dL (ref 3.4–5.0)
Anion Gap: 6 — ABNORMAL LOW (ref 7–16)
BUN: 16 mg/dL (ref 7–18)
Co2: 31 mmol/L (ref 21–32)
Glucose: 93 mg/dL (ref 65–99)
Osmolality: 278 (ref 275–301)
Potassium: 4.8 mmol/L (ref 3.5–5.1)
SGOT(AST): 22 U/L (ref 15–37)
SGPT (ALT): 41 U/L (ref 12–78)
Sodium: 139 mmol/L (ref 136–145)

## 2012-11-01 LAB — CBC CANCER CENTER
Basophil %: 0.8 %
Eosinophil #: 0.2 x10 3/mm (ref 0.0–0.7)
HCT: 40.5 % (ref 40.0–52.0)
HGB: 13.8 g/dL (ref 13.0–18.0)
Lymphocyte #: 2.5 x10 3/mm (ref 1.0–3.6)
Lymphocyte %: 31 %
MCH: 34.3 pg — ABNORMAL HIGH (ref 26.0–34.0)
MCV: 101 fL — ABNORMAL HIGH (ref 80–100)
Monocyte %: 11.3 %
Neutrophil %: 54.1 %
Platelet: 148 x10 3/mm — ABNORMAL LOW (ref 150–440)
RBC: 4.02 10*6/uL — ABNORMAL LOW (ref 4.40–5.90)
RDW: 18.8 % — ABNORMAL HIGH (ref 11.5–14.5)
WBC: 8.2 x10 3/mm (ref 3.8–10.6)

## 2012-11-01 LAB — MAGNESIUM: Magnesium: 1.6 mg/dL — ABNORMAL LOW

## 2012-11-03 ENCOUNTER — Ambulatory Visit
Admission: RE | Admit: 2012-11-03 | Discharge: 2012-11-03 | Disposition: A | Discharge status: Home / Self Care | Payer: Medicare Other | Source: Ambulatory Visit | Attending: Interventional Radiology | Admitting: Interventional Radiology

## 2012-11-03 DIAGNOSIS — C7800 Secondary malignant neoplasm of unspecified lung: Secondary | ICD-10-CM

## 2012-11-03 DIAGNOSIS — C20 Malignant neoplasm of rectum: Secondary | ICD-10-CM

## 2012-11-03 NOTE — Progress Notes (Signed)
Patient ID: Edward Bradley, male   DOB: [birth year], 79 y.o.   MRN: [id number]  ESTABLISHED PATIENT OFFICE VISIT  Chief Complaint: Follow-up of pulmonary metastatic lesions from rectal carcinoma.  History: The patient returns for followup after recent restaging CT imaging. He was initially seen on 09/09/2012 for possible percutaneous ablation of metastatic pulmonary nodules in the right lower lobe and left lower lobe. We elected at that time to reassess lesions after further chemotherapy. The patient remains on chemotherapy per Dr. [name]. CEA has fallen from approximately 4.5 to 2.0 in the last few months. The patient has been feeling well and has no significant symptoms.  Imaging: The most recent chest CT with contrast on 10/18/2012 was obtained by CD and reviewed. This is compared to a PET scan dated 08/04/2012. Based on my measurements, the right lower lobe metastatic nodule has decreased in size from 1.1 cm to 0.8 cm. The left lower lobe metastatic nodule has decreased in size from 2.0 cm to 1.1 cm. Other areas of scarring in both lungs are stable and there are no new metastatic pulmonary lesions.  Assessment and Plan: I met with Edward Bradley today strictly to discuss imaging findings and provide counseling. The report from the recent CT of the chest was compared to a chest CT dated 07/28/2009 and not the most recent PET scan dated 08/04/2012. Clearly, both lower lobe metastatic lesions show response to therapy with further chemotherapy. Based on response, I recommended that we not pursue any percutaneous ablation therapy at this time. The patient is agreeable and will continue to follow up with Dr. [name].

## 2012-11-15 LAB — COMPREHENSIVE METABOLIC PANEL
Albumin: 3.7 g/dL (ref 3.4–5.0)
Alkaline Phosphatase: 63 U/L (ref 50–136)
BUN: 16 mg/dL (ref 7–18)
Bilirubin,Total: 0.4 mg/dL (ref 0.2–1.0)
Co2: 30 mmol/L (ref 21–32)
Creatinine: 1.3 mg/dL (ref 0.60–1.30)
EGFR (African American): >60
Glucose: 135 mg/dL — ABNORMAL HIGH (ref 65–99)
Osmolality: 283 (ref 275–301)
SGOT(AST): 25 U/L (ref 15–37)
Sodium: 140 mmol/L (ref 136–145)
Total Protein: 7.1 g/dL (ref 6.4–8.2)

## 2012-11-15 LAB — CBC CANCER CENTER
Eosinophil #: 0.2 x10 3/mm (ref 0.0–0.7)
Eosinophil %: 3.2 %
HGB: 14.1 g/dL (ref 13.0–18.0)
Lymphocyte #: 2.7 x10 3/mm (ref 1.0–3.6)
MCH: 34.6 pg — ABNORMAL HIGH (ref 26.0–34.0)
MCHC: 34.4 g/dL (ref 32.0–36.0)
Monocyte #: 0.5 x10 3/mm (ref 0.2–1.0)
Monocyte %: 8.7 %
Neutrophil #: 2.4 x10 3/mm (ref 1.4–6.5)
Neutrophil %: 41.7 %
Platelet: 156 x10 3/mm (ref 150–440)
RBC: 4.07 10*6/uL — ABNORMAL LOW (ref 4.40–5.90)
RDW: 17.1 % — ABNORMAL HIGH (ref 11.5–14.5)
WBC: 5.8 x10 3/mm (ref 3.8–10.6)

## 2012-11-27 ENCOUNTER — Ambulatory Visit: Payer: Self-pay | Admitting: Oncology

## 2012-11-29 LAB — CBC CANCER CENTER
Basophil #: 0 x10 3/mm (ref 0.0–0.1)
Eosinophil %: 0.7 %
HCT: 40.8 % (ref 40.0–52.0)
Lymphocyte %: 46.3 %
MCH: 35.1 pg — ABNORMAL HIGH (ref 26.0–34.0)
MCHC: 34.4 g/dL (ref 32.0–36.0)
MCV: 102 fL — ABNORMAL HIGH (ref 80–100)
Monocyte #: 0.5 x10 3/mm (ref 0.2–1.0)
Monocyte %: 8.2 %
Neutrophil %: 44.4 %
RDW: 16.5 % — ABNORMAL HIGH (ref 11.5–14.5)
WBC: 5.6 x10 3/mm (ref 3.8–10.6)

## 2012-11-29 LAB — COMPREHENSIVE METABOLIC PANEL
Albumin: 3.7 g/dL (ref 3.4–5.0)
Alkaline Phosphatase: 56 U/L (ref 50–136)
Anion Gap: 17 — ABNORMAL HIGH (ref 7–16)
BUN: 13 mg/dL (ref 7–18)
Calcium, Total: 9.4 mg/dL (ref 8.5–10.1)
Chloride: 99 mmol/L (ref 98–107)
Co2: 30 mmol/L (ref 21–32)
EGFR (African American): >60
Osmolality: 293 (ref 275–301)
Potassium: 4.7 mmol/L (ref 3.5–5.1)
SGOT(AST): 25 U/L (ref 15–37)
SGPT (ALT): 36 U/L (ref 12–78)
Total Protein: 7 g/dL (ref 6.4–8.2)

## 2012-11-29 LAB — MAGNESIUM: Magnesium: 1.8 mg/dL

## 2012-11-30 LAB — CEA: CEA: 3.2 ng/mL (ref 0.0–4.7)

## 2012-12-13 LAB — CBC CANCER CENTER
Eosinophil #: 0.1 x10 3/mm (ref 0.0–0.7)
Eosinophil %: 1.2 %
HGB: 14.3 g/dL (ref 13.0–18.0)
MCH: 35.3 pg — ABNORMAL HIGH (ref 26.0–34.0)
MCHC: 35 g/dL (ref 32.0–36.0)
Monocyte %: 11.5 %
Neutrophil %: 49.7 %
Platelet: 121 x10 3/mm — ABNORMAL LOW (ref 150–440)
RBC: 4.04 10*6/uL — ABNORMAL LOW (ref 4.40–5.90)
RDW: 16 % — ABNORMAL HIGH (ref 11.5–14.5)
WBC: 6.5 x10 3/mm (ref 3.8–10.6)

## 2012-12-13 LAB — COMPREHENSIVE METABOLIC PANEL
BUN: 16 mg/dL (ref 7–18)
Calcium, Total: 9 mg/dL (ref 8.5–10.1)
Chloride: 101 mmol/L (ref 98–107)
Creatinine: 1.57 mg/dL — ABNORMAL HIGH (ref 0.60–1.30)
EGFR (African American): 48 — ABNORMAL LOW
EGFR (Non-African Amer.): 41 — ABNORMAL LOW
Potassium: 4.5 mmol/L (ref 3.5–5.1)
Sodium: 140 mmol/L (ref 136–145)
Total Protein: 7.2 g/dL (ref 6.4–8.2)

## 2012-12-13 LAB — MAGNESIUM: Magnesium: 1.8 mg/dL

## 2012-12-23 ENCOUNTER — Ambulatory Visit: Payer: Self-pay | Admitting: Ophthalmology

## 2012-12-27 LAB — CBC CANCER CENTER
Basophil %: 0.6 %
Eosinophil #: 0.1 x10 3/mm (ref 0.0–0.7)
Eosinophil %: 1.4 %
Lymphocyte %: 36.4 %
MCH: 34.7 pg — ABNORMAL HIGH (ref 26.0–34.0)
MCHC: 34.8 g/dL (ref 32.0–36.0)
MCV: 100 fL (ref 80–100)
Monocyte #: 0.5 x10 3/mm (ref 0.2–1.0)
Monocyte %: 9.4 %
Neutrophil #: 2.9 x10 3/mm (ref 1.4–6.5)
Neutrophil %: 52.2 %
Platelet: 111 x10 3/mm — ABNORMAL LOW (ref 150–440)
RBC: 4.15 10*6/uL — ABNORMAL LOW (ref 4.40–5.90)
RDW: 16.4 % — ABNORMAL HIGH (ref 11.5–14.5)

## 2012-12-27 LAB — COMPREHENSIVE METABOLIC PANEL
Albumin: 3.9 g/dL (ref 3.4–5.0)
Alkaline Phosphatase: 57 U/L (ref 50–136)
Anion Gap: 12 (ref 7–16)
Bilirubin,Total: 0.4 mg/dL (ref 0.2–1.0)
Creatinine: 1.52 mg/dL — ABNORMAL HIGH (ref 0.60–1.30)
EGFR (African American): 50 — ABNORMAL LOW
Glucose: 143 mg/dL — ABNORMAL HIGH (ref 65–99)
Osmolality: 283 (ref 275–301)
SGOT(AST): 28 U/L (ref 15–37)
SGPT (ALT): 38 U/L (ref 12–78)
Sodium: 141 mmol/L (ref 136–145)

## 2012-12-27 LAB — MAGNESIUM: Magnesium: 1.8 mg/dL

## 2012-12-28 ENCOUNTER — Ambulatory Visit: Payer: Self-pay | Admitting: Oncology

## 2012-12-28 LAB — CEA: CEA: 3.6 ng/mL (ref 0.0–4.7)

## 2013-01-03 ENCOUNTER — Ambulatory Visit: Payer: Self-pay | Admitting: Ophthalmology

## 2013-01-17 LAB — COMPREHENSIVE METABOLIC PANEL
BUN: 13 mg/dL (ref 7–18)
Bilirubin,Total: 0.4 mg/dL (ref 0.2–1.0)
Calcium, Total: 9.6 mg/dL (ref 8.5–10.1)
Chloride: 103 mmol/L (ref 98–107)
Co2: 32 mmol/L (ref 21–32)
EGFR (African American): >60
Glucose: 99 mg/dL (ref 65–99)
Osmolality: 276 (ref 275–301)
Total Protein: 7.3 g/dL (ref 6.4–8.2)

## 2013-01-17 LAB — CBC CANCER CENTER
Basophil #: 0 x10 3/mm (ref 0.0–0.1)
Eosinophil #: 0.1 x10 3/mm (ref 0.0–0.7)
HGB: 14.5 g/dL (ref 13.0–18.0)
MCH: 34.8 pg — ABNORMAL HIGH (ref 26.0–34.0)
MCV: 100 fL (ref 80–100)
Monocyte %: 15.1 %
Neutrophil %: 41.8 %
RBC: 4.18 10*6/uL — ABNORMAL LOW (ref 4.40–5.90)
RDW: 17.5 % — ABNORMAL HIGH (ref 11.5–14.5)
WBC: 4.8 x10 3/mm (ref 3.8–10.6)

## 2013-01-17 LAB — MAGNESIUM: Magnesium: 1.8 mg/dL

## 2013-01-27 ENCOUNTER — Ambulatory Visit: Payer: Self-pay | Admitting: Oncology

## 2013-02-03 LAB — COMPREHENSIVE METABOLIC PANEL
Albumin: 3.8 g/dL (ref 3.4–5.0)
Alkaline Phosphatase: 51 U/L (ref 50–136)
BUN: 14 mg/dL (ref 7–18)
Calcium, Total: 9.3 mg/dL (ref 8.5–10.1)
Chloride: 104 mmol/L (ref 98–107)
Creatinine: 1.48 mg/dL — ABNORMAL HIGH (ref 0.60–1.30)
EGFR (African American): 51 — ABNORMAL LOW
EGFR (Non-African Amer.): 44 — ABNORMAL LOW
Glucose: 117 mg/dL — ABNORMAL HIGH (ref 65–99)
Potassium: 3.9 mmol/L (ref 3.5–5.1)
Sodium: 141 mmol/L (ref 136–145)
Total Protein: 7 g/dL (ref 6.4–8.2)

## 2013-02-03 LAB — CBC CANCER CENTER
Basophil #: 0 x10 3/mm (ref 0.0–0.1)
Basophil %: 0.4 %
HCT: 38.4 % — ABNORMAL LOW (ref 40.0–52.0)
Lymphocyte #: 2.1 x10 3/mm (ref 1.0–3.6)
MCHC: 34.9 g/dL (ref 32.0–36.0)
MCV: 98 fL (ref 80–100)
Monocyte #: 0.4 x10 3/mm (ref 0.2–1.0)
Monocyte %: 13.5 %
Neutrophil #: 0.4 x10 3/mm — ABNORMAL LOW (ref 1.4–6.5)
RDW: 17.4 % — ABNORMAL HIGH (ref 11.5–14.5)
WBC: 3.1 x10 3/mm — ABNORMAL LOW (ref 3.8–10.6)

## 2013-02-11 LAB — CBC CANCER CENTER
Basophil %: 0.7 %
HGB: 13.2 g/dL (ref 13.0–18.0)
Lymphocyte #: 1.9 x10 3/mm (ref 1.0–3.6)
Lymphocyte %: 49 %
MCHC: 34.4 g/dL (ref 32.0–36.0)
Neutrophil #: 1.2 x10 3/mm — ABNORMAL LOW (ref 1.4–6.5)
Neutrophil %: 32.6 %
Platelet: 102 x10 3/mm — ABNORMAL LOW (ref 150–440)
RBC: 3.83 10*6/uL — ABNORMAL LOW (ref 4.40–5.90)
WBC: 3.8 x10 3/mm (ref 3.8–10.6)

## 2013-02-27 ENCOUNTER — Ambulatory Visit: Payer: Self-pay | Admitting: Oncology

## 2013-02-28 ENCOUNTER — Ambulatory Visit: Payer: Self-pay | Admitting: Oncology

## 2013-03-03 ENCOUNTER — Ambulatory Visit: Payer: Self-pay | Admitting: Oncology

## 2013-03-03 LAB — COMPREHENSIVE METABOLIC PANEL
Albumin: 3.7 g/dL (ref 3.4–5.0)
Anion Gap: 16 (ref 7–16)
BUN: 10 mg/dL (ref 7–18)
Bilirubin,Total: 0.4 mg/dL (ref 0.2–1.0)
Calcium, Total: 9.2 mg/dL (ref 8.5–10.1)
Co2: 23 mmol/L (ref 21–32)
Creatinine: 1.36 mg/dL — ABNORMAL HIGH (ref 0.60–1.30)
EGFR (African American): 57 — ABNORMAL LOW
Glucose: 162 mg/dL — ABNORMAL HIGH (ref 65–99)
Potassium: 3.8 mmol/L (ref 3.5–5.1)
SGOT(AST): 33 U/L (ref 15–37)
Sodium: 139 mmol/L (ref 136–145)

## 2013-03-03 LAB — CBC CANCER CENTER
Basophil %: 0.9 %
Eosinophil #: 0.1 x10 3/mm (ref 0.0–0.7)
Eosinophil %: 1.7 %
HGB: 13.4 g/dL (ref 13.0–18.0)
Lymphocyte #: 2.4 x10 3/mm (ref 1.0–3.6)
MCH: 36 pg — ABNORMAL HIGH (ref 26.0–34.0)
MCHC: 36 g/dL (ref 32.0–36.0)
Monocyte #: 0.6 x10 3/mm (ref 0.2–1.0)
Monocyte %: 14.4 %
Neutrophil #: 0.8 x10 3/mm — ABNORMAL LOW (ref 1.4–6.5)
Neutrophil %: 21.6 %
Platelet: 169 x10 3/mm (ref 150–440)
RBC: 3.72 10*6/uL — ABNORMAL LOW (ref 4.40–5.90)
WBC: 3.9 x10 3/mm (ref 3.8–10.6)

## 2013-03-10 LAB — CBC CANCER CENTER
Basophil #: 0 x10 3/mm (ref 0.0–0.1)
Eosinophil %: 0.9 %
HCT: 38.3 % — ABNORMAL LOW (ref 40.0–52.0)
HGB: 13.7 g/dL (ref 13.0–18.0)
Lymphocyte #: 2.3 x10 3/mm (ref 1.0–3.6)
Lymphocyte %: 44 %
MCH: 36.3 pg — ABNORMAL HIGH (ref 26.0–34.0)
MCHC: 35.8 g/dL (ref 32.0–36.0)
Monocyte #: 0.8 x10 3/mm (ref 0.2–1.0)
Monocyte %: 15 %
Neutrophil #: 2.1 x10 3/mm (ref 1.4–6.5)
Neutrophil %: 39.6 %
WBC: 5.3 x10 3/mm (ref 3.8–10.6)

## 2013-03-28 ENCOUNTER — Ambulatory Visit: Payer: Self-pay | Admitting: Ophthalmology

## 2013-03-29 ENCOUNTER — Ambulatory Visit: Payer: Self-pay | Admitting: Oncology

## 2013-04-04 LAB — CBC CANCER CENTER
Eosinophil #: 0 x10 3/mm (ref 0.0–0.7)
Eosinophil %: 0 %
HCT: 41.8 % (ref 40.0–52.0)
HGB: 14.4 g/dL (ref 13.0–18.0)
Lymphocyte #: 1.3 x10 3/mm (ref 1.0–3.6)
MCH: 34.7 pg — ABNORMAL HIGH (ref 26.0–34.0)
MCHC: 34.4 g/dL (ref 32.0–36.0)
Monocyte %: 1 %
Neutrophil #: 7.3 x10 3/mm — ABNORMAL HIGH (ref 1.4–6.5)
RDW: 16.3 % — ABNORMAL HIGH (ref 11.5–14.5)
WBC: 8.7 x10 3/mm (ref 3.8–10.6)

## 2013-04-04 LAB — COMPREHENSIVE METABOLIC PANEL
Albumin: 3.6 g/dL (ref 3.4–5.0)
Alkaline Phosphatase: 86 U/L (ref 50–136)
BUN: 16 mg/dL (ref 7–18)
Calcium, Total: 10 mg/dL (ref 8.5–10.1)
EGFR (African American): 46 — ABNORMAL LOW
EGFR (Non-African Amer.): 40 — ABNORMAL LOW
Osmolality: 279 (ref 275–301)
Potassium: 4.9 mmol/L (ref 3.5–5.1)
Total Protein: 8.3 g/dL — ABNORMAL HIGH (ref 6.4–8.2)

## 2013-04-05 LAB — CEA: CEA: 2.7 ng/mL (ref 0.0–4.7)

## 2013-04-21 LAB — COMPREHENSIVE METABOLIC PANEL
Alkaline Phosphatase: 95 U/L (ref 50–136)
BUN: 29 mg/dL — ABNORMAL HIGH (ref 7–18)
Calcium, Total: 9.6 mg/dL (ref 8.5–10.1)
Chloride: 96 mmol/L — ABNORMAL LOW (ref 98–107)
Creatinine: 1.46 mg/dL — ABNORMAL HIGH (ref 0.60–1.30)
EGFR (African American): 52 — ABNORMAL LOW
EGFR (Non-African Amer.): 45 — ABNORMAL LOW
Glucose: 214 mg/dL — ABNORMAL HIGH (ref 65–99)
Osmolality: 282 (ref 275–301)
Potassium: 4.9 mmol/L (ref 3.5–5.1)
SGOT(AST): 41 U/L — ABNORMAL HIGH (ref 15–37)

## 2013-04-21 LAB — CBC CANCER CENTER
Basophil #: 0 x10 3/mm (ref 0.0–0.1)
Basophil %: 0.3 %
Eosinophil #: 0 x10 3/mm (ref 0.0–0.7)
Eosinophil %: 0 %
HCT: 40.7 % (ref 40.0–52.0)
Lymphocyte #: 1.6 x10 3/mm (ref 1.0–3.6)
Lymphocyte %: 19.2 %
MCHC: 34.5 g/dL (ref 32.0–36.0)
MCV: 100 fL (ref 80–100)
Monocyte #: 1.5 x10 3/mm — ABNORMAL HIGH (ref 0.2–1.0)
Monocyte %: 17.2 %
Platelet: 203 x10 3/mm (ref 150–440)
RBC: 4.06 10*6/uL — ABNORMAL LOW (ref 4.40–5.90)
RDW: 16.9 % — ABNORMAL HIGH (ref 11.5–14.5)
WBC: 8.5 x10 3/mm (ref 3.8–10.6)

## 2013-04-29 ENCOUNTER — Ambulatory Visit: Payer: Self-pay | Admitting: Oncology

## 2013-05-09 ENCOUNTER — Ambulatory Visit: Payer: Self-pay | Admitting: Oncology

## 2013-05-09 LAB — CBC CANCER CENTER
Eosinophil %: 0.9 %
HCT: 42.1 % (ref 40.0–52.0)
HGB: 14.6 g/dL (ref 13.0–18.0)
MCH: 34.6 pg — ABNORMAL HIGH (ref 26.0–34.0)
MCHC: 34.6 g/dL (ref 32.0–36.0)
MCV: 100 fL (ref 80–100)
Neutrophil #: 3.9 x10 3/mm (ref 1.4–6.5)
Neutrophil %: 61.6 %
Platelet: 51 x10 3/mm — ABNORMAL LOW (ref 150–440)
RBC: 4.22 10*6/uL — ABNORMAL LOW (ref 4.40–5.90)

## 2013-05-09 LAB — COMPREHENSIVE METABOLIC PANEL
Albumin: 3.3 g/dL — ABNORMAL LOW (ref 3.4–5.0)
BUN: 13 mg/dL (ref 7–18)
Co2: 31 mmol/L (ref 21–32)
Creatinine: 1.01 mg/dL (ref 0.60–1.30)
EGFR (African American): >60
EGFR (Non-African Amer.): >60
Potassium: 4.6 mmol/L (ref 3.5–5.1)
SGPT (ALT): 109 U/L — ABNORMAL HIGH (ref 12–78)
Sodium: 139 mmol/L (ref 136–145)

## 2013-05-09 LAB — MAGNESIUM: Magnesium: 1.5 mg/dL — ABNORMAL LOW

## 2013-05-16 ENCOUNTER — Emergency Department: Payer: Self-pay | Admitting: Emergency Medicine

## 2013-05-16 LAB — CBC
HGB: 14.3 g/dL (ref 13.0–18.0)
MCHC: 34.3 g/dL (ref 32.0–36.0)
MCV: 97 fL (ref 80–100)
Platelet: 41 10*3/uL — ABNORMAL LOW (ref 150–440)
RBC: 4.27 10*6/uL — ABNORMAL LOW (ref 4.40–5.90)
RDW: 16.7 % — ABNORMAL HIGH (ref 11.5–14.5)

## 2013-05-16 LAB — BASIC METABOLIC PANEL
BUN: 16 mg/dL (ref 7–18)
Calcium, Total: 9.5 mg/dL (ref 8.5–10.1)
Co2: 25 mmol/L (ref 21–32)
EGFR (African American): >60
EGFR (Non-African Amer.): >60
Sodium: 138 mmol/L (ref 136–145)

## 2013-05-16 LAB — TROPONIN I: Troponin-I: <0.02 ng/mL

## 2013-05-30 ENCOUNTER — Ambulatory Visit: Payer: Self-pay | Admitting: Oncology

## 2013-05-31 LAB — COMPREHENSIVE METABOLIC PANEL
Albumin: 3.6 g/dL (ref 3.4–5.0)
Anion Gap: 10 (ref 7–16)
BUN: 10 mg/dL (ref 7–18)
Bilirubin,Total: 0.3 mg/dL (ref 0.2–1.0)
Calcium, Total: 9.9 mg/dL (ref 8.5–10.1)
Chloride: 101 mmol/L (ref 98–107)
Co2: 28 mmol/L (ref 21–32)
Creatinine: 1.35 mg/dL — ABNORMAL HIGH (ref 0.60–1.30)
Osmolality: 282 (ref 275–301)
Potassium: 4.6 mmol/L (ref 3.5–5.1)
SGPT (ALT): 54 U/L (ref 12–78)

## 2013-05-31 LAB — CBC CANCER CENTER
Basophil %: 1.1 %
Eosinophil %: 0.4 %
HCT: 37.3 % — ABNORMAL LOW (ref 40.0–52.0)
Lymphocyte #: 2.8 x10 3/mm (ref 1.0–3.6)
Lymphocyte %: 46.9 %
MCH: 33.9 pg (ref 26.0–34.0)
MCHC: 34.8 g/dL (ref 32.0–36.0)
MCV: 97 fL (ref 80–100)
Monocyte #: 0.6 x10 3/mm (ref 0.2–1.0)
RBC: 3.83 10*6/uL — ABNORMAL LOW (ref 4.40–5.90)
WBC: 6 x10 3/mm (ref 3.8–10.6)

## 2013-05-31 LAB — MAGNESIUM: Magnesium: 1.9 mg/dL

## 2013-06-16 LAB — COMPREHENSIVE METABOLIC PANEL
Alkaline Phosphatase: 66 U/L (ref 50–136)
Anion Gap: 11 (ref 7–16)
Calcium, Total: 9.8 mg/dL (ref 8.5–10.1)
Chloride: 104 mmol/L (ref 98–107)
Co2: 26 mmol/L (ref 21–32)
Creatinine: 1.24 mg/dL (ref 0.60–1.30)
EGFR (African American): >60
EGFR (Non-African Amer.): 55 — ABNORMAL LOW
Glucose: 111 mg/dL — ABNORMAL HIGH (ref 65–99)
Osmolality: 282 (ref 275–301)
Potassium: 4.1 mmol/L (ref 3.5–5.1)
SGOT(AST): 33 U/L (ref 15–37)
Sodium: 141 mmol/L (ref 136–145)
Total Protein: 6.9 g/dL (ref 6.4–8.2)

## 2013-06-16 LAB — CBC CANCER CENTER
Eosinophil #: 0.1 x10 3/mm (ref 0.0–0.7)
Eosinophil %: 1.8 %
HGB: 13.3 g/dL (ref 13.0–18.0)
Lymphocyte #: 2.6 x10 3/mm (ref 1.0–3.6)
Lymphocyte %: 65.6 %
MCV: 98 fL (ref 80–100)
Monocyte #: 0.6 x10 3/mm (ref 0.2–1.0)
Monocyte %: 14 %
Neutrophil #: 0.7 x10 3/mm — ABNORMAL LOW (ref 1.4–6.5)
Platelet: 115 x10 3/mm — ABNORMAL LOW (ref 150–440)
RBC: 3.91 10*6/uL — ABNORMAL LOW (ref 4.40–5.90)
RDW: 19.3 % — ABNORMAL HIGH (ref 11.5–14.5)
WBC: 4 x10 3/mm (ref 3.8–10.6)

## 2013-06-17 LAB — CEA: CEA: 3.7 ng/mL (ref 0.0–4.7)

## 2013-06-29 ENCOUNTER — Ambulatory Visit: Payer: Self-pay | Admitting: Oncology

## 2013-07-08 LAB — CEA: CEA: 2.2 ng/mL (ref 0.0–4.7)

## 2013-07-12 LAB — CBC CANCER CENTER
Basophil #: 0 x10 3/mm (ref 0.0–0.1)
Basophil %: 0.8 %
Eosinophil #: 0.3 x10 3/mm (ref 0.0–0.7)
Eosinophil %: 4 %
HGB: 13.6 g/dL (ref 13.0–18.0)
Lymphocyte %: 35.6 %
MCH: 34.4 pg — ABNORMAL HIGH (ref 26.0–34.0)
MCHC: 34.7 g/dL (ref 32.0–36.0)
Monocyte #: 0.6 x10 3/mm (ref 0.2–1.0)
Monocyte %: 9.2 %
Neutrophil #: 3.3 x10 3/mm (ref 1.4–6.5)
Platelet: 175 x10 3/mm (ref 150–440)
RBC: 3.94 10*6/uL — ABNORMAL LOW (ref 4.40–5.90)
RDW: 17.5 % — ABNORMAL HIGH (ref 11.5–14.5)
WBC: 6.5 x10 3/mm (ref 3.8–10.6)

## 2013-07-12 LAB — COMPREHENSIVE METABOLIC PANEL
Albumin: 3.7 g/dL (ref 3.4–5.0)
Alkaline Phosphatase: 54 U/L (ref 50–136)
Anion Gap: 4 — ABNORMAL LOW (ref 7–16)
BUN: 12 mg/dL (ref 7–18)
Calcium, Total: 9.1 mg/dL (ref 8.5–10.1)
Creatinine: 1 mg/dL (ref 0.60–1.30)
EGFR (African American): >60
EGFR (Non-African Amer.): >60
Glucose: 129 mg/dL — ABNORMAL HIGH (ref 65–99)
Osmolality: 279 (ref 275–301)
SGOT(AST): 46 U/L — ABNORMAL HIGH (ref 15–37)
SGPT (ALT): 47 U/L (ref 12–78)
Total Protein: 6.8 g/dL (ref 6.4–8.2)

## 2013-07-12 LAB — MAGNESIUM: Magnesium: 1.6 mg/dL — ABNORMAL LOW

## 2013-07-28 LAB — COMPREHENSIVE METABOLIC PANEL
Albumin: 3.8 g/dL (ref 3.4–5.0)
BUN: 13 mg/dL (ref 7–18)
Bilirubin,Total: 0.5 mg/dL (ref 0.2–1.0)
Co2: 30 mmol/L (ref 21–32)
Creatinine: 1.12 mg/dL (ref 0.60–1.30)
EGFR (Non-African Amer.): >60
Glucose: 133 mg/dL — ABNORMAL HIGH (ref 65–99)
Osmolality: 283 (ref 275–301)
SGPT (ALT): 53 U/L (ref 12–78)
Total Protein: 7 g/dL (ref 6.4–8.2)

## 2013-07-28 LAB — CBC CANCER CENTER
Basophil #: 0 x10 3/mm (ref 0.0–0.1)
Basophil %: 0.7 %
Eosinophil #: 0.1 x10 3/mm (ref 0.0–0.7)
HGB: 14.4 g/dL (ref 13.0–18.0)
Lymphocyte #: 2.1 x10 3/mm (ref 1.0–3.6)
Lymphocyte %: 37 %
MCHC: 33 g/dL (ref 32.0–36.0)
MCV: 99 fL (ref 80–100)
Monocyte #: 0.5 x10 3/mm (ref 0.2–1.0)
Monocyte %: 9.6 %
Neutrophil %: 50.6 %
Platelet: 123 x10 3/mm — ABNORMAL LOW (ref 150–440)
RBC: 4.39 10*6/uL — ABNORMAL LOW (ref 4.40–5.90)

## 2013-07-28 LAB — MAGNESIUM: Magnesium: 1.4 mg/dL — ABNORMAL LOW

## 2013-07-30 ENCOUNTER — Ambulatory Visit: Payer: Self-pay | Admitting: Oncology

## 2013-08-09 ENCOUNTER — Encounter: Payer: Self-pay | Admitting: Podiatry

## 2013-08-09 ENCOUNTER — Ambulatory Visit (INDEPENDENT_AMBULATORY_CARE_PROVIDER_SITE_OTHER): Payer: Medicare Other | Admitting: Podiatry

## 2013-08-09 VITALS — BP 115/68 | HR 77 | Resp 16 | Ht 66.0 in | Wt 152.0 lb

## 2013-08-09 DIAGNOSIS — L03039 Cellulitis of unspecified toe: Secondary | ICD-10-CM

## 2013-08-09 NOTE — Progress Notes (Signed)
Subjective:     Patient ID: Edward Bradley, male   DOB: 19-Oct-1930, 79 y.o.   MRN: [id number]  HPI patient points to the right foot stating this nail is still bothering him he some   Review of Systems     Objective:   Physical Exam  Nursing note and vitals reviewed. Constitutional: He is oriented to person, place, and time.  Cardiovascular: Intact distal pulses.   Musculoskeletal: Normal range of motion.  Neurological: He is oriented to person, place, and time.   patient's right lateral border of the hallux is irritated with incurvation and some drainage     Assessment:     Paronychia infection present right hallux lateral border    Plan:     Explained condition to patient and carefully debrided the lateral border and flushed the tissue. Dressing applied and soaks will be continued reappoint if symptoms do not get better

## 2013-08-18 LAB — COMPREHENSIVE METABOLIC PANEL
Albumin: 4.1 g/dL (ref 3.4–5.0)
Calcium, Total: 9.3 mg/dL (ref 8.5–10.1)
Chloride: 104 mmol/L (ref 98–107)
Co2: 31 mmol/L (ref 21–32)
Creatinine: 1.15 mg/dL (ref 0.60–1.30)
EGFR (African American): >60
Osmolality: 288 (ref 275–301)
SGOT(AST): 42 U/L — ABNORMAL HIGH (ref 15–37)
SGPT (ALT): 52 U/L (ref 12–78)
Sodium: 143 mmol/L (ref 136–145)
Total Protein: 7.6 g/dL (ref 6.4–8.2)

## 2013-08-18 LAB — CBC CANCER CENTER
Basophil #: 0 x10 3/mm (ref 0.0–0.1)
Eosinophil #: 0.1 x10 3/mm (ref 0.0–0.7)
Eosinophil %: 1.1 %
HCT: 49.7 % (ref 40.0–52.0)
Lymphocyte %: 39.7 %
MCHC: 32.6 g/dL (ref 32.0–36.0)
Monocyte #: 0.6 x10 3/mm (ref 0.2–1.0)
Monocyte %: 7.7 %
Neutrophil #: 3.8 x10 3/mm (ref 1.4–6.5)
Neutrophil %: 50.9 %
Platelet: 149 x10 3/mm — ABNORMAL LOW (ref 150–440)
RDW: 15.4 % — ABNORMAL HIGH (ref 11.5–14.5)
WBC: 7.4 x10 3/mm (ref 3.8–10.6)

## 2013-08-19 LAB — CEA: CEA: 2.3 ng/mL (ref 0.0–4.7)

## 2013-08-22 LAB — MAGNESIUM: Magnesium: 1.1 mg/dL — ABNORMAL LOW

## 2013-08-29 ENCOUNTER — Ambulatory Visit: Payer: Self-pay | Admitting: Oncology

## 2013-09-08 LAB — CBC CANCER CENTER
Basophil %: 0.6 %
Eosinophil #: 0.1 x10 3/mm (ref 0.0–0.7)
Eosinophil %: 0.9 %
HGB: 16.1 g/dL (ref 13.0–18.0)
Lymphocyte #: 2.4 x10 3/mm (ref 1.0–3.6)
Lymphocyte %: 29.3 %
MCH: 31.1 pg (ref 26.0–34.0)
MCHC: 32.5 g/dL (ref 32.0–36.0)
Monocyte #: 0.7 x10 3/mm (ref 0.2–1.0)
Monocyte %: 8.1 %
Neutrophil %: 61.1 %
Platelet: 144 x10 3/mm — ABNORMAL LOW (ref 150–440)
WBC: 8.1 x10 3/mm (ref 3.8–10.6)

## 2013-09-08 LAB — COMPREHENSIVE METABOLIC PANEL
Albumin: 3.8 g/dL (ref 3.4–5.0)
Alkaline Phosphatase: 61 U/L
Anion Gap: 7 (ref 7–16)
BUN: 14 mg/dL (ref 7–18)
Calcium, Total: 9.1 mg/dL (ref 8.5–10.1)
Chloride: 104 mmol/L (ref 98–107)
Co2: 31 mmol/L (ref 21–32)
Creatinine: 1.17 mg/dL (ref 0.60–1.30)
EGFR (Non-African Amer.): 59 — ABNORMAL LOW
Osmolality: 284 (ref 275–301)
Potassium: 4.3 mmol/L (ref 3.5–5.1)
SGPT (ALT): 55 U/L (ref 12–78)
Total Protein: 7.3 g/dL (ref 6.4–8.2)

## 2013-09-08 LAB — MAGNESIUM: Magnesium: 1 mg/dL — ABNORMAL LOW

## 2013-09-13 LAB — CEA: CEA: 3.1 ng/mL (ref 0.0–4.7)

## 2013-09-29 ENCOUNTER — Ambulatory Visit: Payer: Self-pay | Admitting: Oncology

## 2013-09-30 LAB — CBC CANCER CENTER
Basophil #: 0 x10 3/mm (ref 0.0–0.1)
Basophil %: 0.6 %
EOS PCT: 0.9 %
Eosinophil #: 0.1 x10 3/mm (ref 0.0–0.7)
HCT: 47.3 % (ref 40.0–52.0)
HGB: 15.6 g/dL (ref 13.0–18.0)
LYMPHS ABS: 2.2 x10 3/mm (ref 1.0–3.6)
LYMPHS PCT: 27.2 %
MCH: 30.9 pg (ref 26.0–34.0)
MCHC: 33.1 g/dL (ref 32.0–36.0)
MCV: 94 fL (ref 80–100)
MONO ABS: 0.7 x10 3/mm (ref 0.2–1.0)
Monocyte %: 8.8 %
Neutrophil #: 5.1 x10 3/mm (ref 1.4–6.5)
Neutrophil %: 62.5 %
PLATELETS: 116 x10 3/mm — AB (ref 150–440)
RBC: 5.05 10*6/uL (ref 4.40–5.90)
RDW: 15.2 % — ABNORMAL HIGH (ref 11.5–14.5)
WBC: 8.1 x10 3/mm (ref 3.8–10.6)

## 2013-09-30 LAB — COMPREHENSIVE METABOLIC PANEL
ALBUMIN: 3.6 g/dL (ref 3.4–5.0)
ANION GAP: 9 (ref 7–16)
Alkaline Phosphatase: 53 U/L
BUN: 12 mg/dL (ref 7–18)
Bilirubin,Total: 0.5 mg/dL (ref 0.2–1.0)
CALCIUM: 8.9 mg/dL (ref 8.5–10.1)
CO2: 29 mmol/L (ref 21–32)
Chloride: 104 mmol/L (ref 98–107)
Creatinine: 1.26 mg/dL (ref 0.60–1.30)
EGFR (African American): >60
EGFR (Non-African Amer.): 54 — ABNORMAL LOW
Glucose: 125 mg/dL — ABNORMAL HIGH (ref 65–99)
Osmolality: 284 (ref 275–301)
Potassium: 4.2 mmol/L (ref 3.5–5.1)
SGOT(AST): 41 U/L — ABNORMAL HIGH (ref 15–37)
SGPT (ALT): 56 U/L (ref 12–78)
SODIUM: 142 mmol/L (ref 136–145)
Total Protein: 6.9 g/dL (ref 6.4–8.2)

## 2013-09-30 LAB — MAGNESIUM: Magnesium: 0.8 mg/dL — ABNORMAL LOW

## 2013-10-01 LAB — CEA: CEA: 3.6 ng/mL (ref 0.0–4.7)

## 2013-10-05 ENCOUNTER — Telehealth: Payer: Self-pay | Admitting: *Deleted

## 2013-10-05 MED ORDER — CEPHALEXIN 500 MG PO CAPS: 500.0000 mg | ORAL_CAPSULE | Freq: Two times a day (BID) | ORAL | Status: AC

## 2013-10-05 NOTE — Telephone Encounter (Signed)
Pt called said his toe was oozing,bleeding and hurts. Would like an antibiotic for it sent over to [facility]. Made pt appt for Friday 1.9.15 and told pt to continue with soaks.

## 2013-10-05 NOTE — Telephone Encounter (Signed)
Ok cephalexin if he is not allergic. 500mg . bid

## 2013-10-07 ENCOUNTER — Encounter: Payer: Self-pay | Admitting: Podiatry

## 2013-10-07 ENCOUNTER — Ambulatory Visit (INDEPENDENT_AMBULATORY_CARE_PROVIDER_SITE_OTHER): Payer: Medicare Other | Admitting: Podiatry

## 2013-10-07 VITALS — BP 137/72 | HR 85 | Resp 16 | Ht 66.0 in | Wt 155.0 lb

## 2013-10-07 DIAGNOSIS — L03039 Cellulitis of unspecified toe: Secondary | ICD-10-CM

## 2013-10-07 NOTE — Patient Instructions (Signed)

## 2013-10-09 NOTE — Progress Notes (Signed)
Subjective:     Patient ID: Edward Bradley, male   DOB: 1931/09/13, 80 y.o.   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 Reappoint as needed

## 2013-10-12 LAB — COMPREHENSIVE METABOLIC PANEL
AST: 51 U/L — AB (ref 15–37)
Albumin: 3.7 g/dL (ref 3.4–5.0)
Alkaline Phosphatase: 64 U/L
Anion Gap: 13 (ref 7–16)
BILIRUBIN TOTAL: 0.3 mg/dL (ref 0.2–1.0)
BUN: 13 mg/dL (ref 7–18)
CALCIUM: 9 mg/dL (ref 8.5–10.1)
CHLORIDE: 103 mmol/L (ref 98–107)
Co2: 26 mmol/L (ref 21–32)
Creatinine: 1.13 mg/dL (ref 0.60–1.30)
EGFR (Non-African Amer.): >60
Glucose: 143 mg/dL — ABNORMAL HIGH (ref 65–99)
Osmolality: 286 (ref 275–301)
Potassium: 4.2 mmol/L (ref 3.5–5.1)
SGPT (ALT): 61 U/L (ref 12–78)
Sodium: 142 mmol/L (ref 136–145)
TOTAL PROTEIN: 7.1 g/dL (ref 6.4–8.2)

## 2013-10-12 LAB — CBC CANCER CENTER
Basophil #: 0 x10 3/mm (ref 0.0–0.1)
Basophil %: 0.4 %
EOS ABS: 0.1 x10 3/mm (ref 0.0–0.7)
EOS PCT: 1.4 %
HCT: 45.3 % (ref 40.0–52.0)
HGB: 15 g/dL (ref 13.0–18.0)
LYMPHS ABS: 2.6 x10 3/mm (ref 1.0–3.6)
Lymphocyte %: 34.4 %
MCH: 30.3 pg (ref 26.0–34.0)
MCHC: 33 g/dL (ref 32.0–36.0)
MCV: 92 fL (ref 80–100)
MONO ABS: 0.9 x10 3/mm (ref 0.2–1.0)
Monocyte %: 11.3 %
NEUTROS PCT: 52.5 %
Neutrophil #: 3.9 x10 3/mm (ref 1.4–6.5)
Platelet: 166 x10 3/mm (ref 150–440)
RBC: 4.93 10*6/uL (ref 4.40–5.90)
RDW: 15.1 % — AB (ref 11.5–14.5)
WBC: 7.5 x10 3/mm (ref 3.8–10.6)

## 2013-10-12 LAB — MAGNESIUM: MAGNESIUM: 1.1 mg/dL — AB

## 2013-10-13 LAB — CEA: CEA: 3.6 ng/mL (ref 0.0–4.7)

## 2013-10-26 LAB — CBC CANCER CENTER
Basophil #: 0 x10 3/mm (ref 0.0–0.1)
Basophil %: 0.4 %
EOS ABS: 0.1 x10 3/mm (ref 0.0–0.7)
Eosinophil %: 0.8 %
HCT: 43 % (ref 40.0–52.0)
HGB: 14.2 g/dL (ref 13.0–18.0)
LYMPHS ABS: 2 x10 3/mm (ref 1.0–3.6)
Lymphocyte %: 21.4 %
MCH: 30.2 pg (ref 26.0–34.0)
MCHC: 33.2 g/dL (ref 32.0–36.0)
MCV: 91 fL (ref 80–100)
Monocyte #: 0.9 x10 3/mm (ref 0.2–1.0)
Monocyte %: 9.9 %
NEUTROS ABS: 6.4 x10 3/mm (ref 1.4–6.5)
NEUTROS PCT: 67.5 %
Platelet: 169 x10 3/mm (ref 150–440)
RBC: 4.72 10*6/uL (ref 4.40–5.90)
RDW: 15.2 % — AB (ref 11.5–14.5)
WBC: 9.5 x10 3/mm (ref 3.8–10.6)

## 2013-10-26 LAB — COMPREHENSIVE METABOLIC PANEL
ALT: 35 U/L (ref 12–78)
ANION GAP: 8 (ref 7–16)
Albumin: 3.6 g/dL (ref 3.4–5.0)
Alkaline Phosphatase: 59 U/L
BUN: 14 mg/dL (ref 7–18)
Bilirubin,Total: 0.5 mg/dL (ref 0.2–1.0)
CHLORIDE: 102 mmol/L (ref 98–107)
CREATININE: 1.25 mg/dL (ref 0.60–1.30)
Calcium, Total: 8.6 mg/dL (ref 8.5–10.1)
Co2: 28 mmol/L (ref 21–32)
EGFR (African American): >60
EGFR (Non-African Amer.): 54 — ABNORMAL LOW
GLUCOSE: 125 mg/dL — AB (ref 65–99)
OSMOLALITY: 278 (ref 275–301)
Potassium: 4.4 mmol/L (ref 3.5–5.1)
SGOT(AST): 31 U/L (ref 15–37)
SODIUM: 138 mmol/L (ref 136–145)
Total Protein: 7 g/dL (ref 6.4–8.2)

## 2013-10-26 LAB — MAGNESIUM: Magnesium: 1.6 mg/dL — ABNORMAL LOW

## 2013-10-27 LAB — CEA: CEA: 4.3 ng/mL (ref 0.0–4.7)

## 2013-10-30 ENCOUNTER — Ambulatory Visit: Payer: Self-pay | Admitting: Oncology

## 2013-11-14 LAB — COMPREHENSIVE METABOLIC PANEL
ANION GAP: 11 (ref 7–16)
Albumin: 3.9 g/dL (ref 3.4–5.0)
Alkaline Phosphatase: 72 U/L
BILIRUBIN TOTAL: 0.3 mg/dL (ref 0.2–1.0)
BUN: 10 mg/dL (ref 7–18)
CO2: 29 mmol/L (ref 21–32)
Calcium, Total: 9.3 mg/dL (ref 8.5–10.1)
Chloride: 103 mmol/L (ref 98–107)
Creatinine: 1.34 mg/dL — ABNORMAL HIGH (ref 0.60–1.30)
EGFR (African American): 58 — ABNORMAL LOW
EGFR (Non-African Amer.): 50 — ABNORMAL LOW
GLUCOSE: 137 mg/dL — AB (ref 65–99)
Osmolality: 286 (ref 275–301)
Potassium: 4.8 mmol/L (ref 3.5–5.1)
SGOT(AST): 31 U/L (ref 15–37)
SGPT (ALT): 41 U/L (ref 12–78)
SODIUM: 143 mmol/L (ref 136–145)
Total Protein: 7.1 g/dL (ref 6.4–8.2)

## 2013-11-14 LAB — CBC CANCER CENTER
Basophil #: 0 x10 3/mm (ref 0.0–0.1)
Basophil %: 0.7 %
EOS ABS: 0.1 x10 3/mm (ref 0.0–0.7)
Eosinophil %: 2.8 %
HCT: 43.8 % (ref 40.0–52.0)
HGB: 14.4 g/dL (ref 13.0–18.0)
Lymphocyte #: 2 x10 3/mm (ref 1.0–3.6)
Lymphocyte %: 52.5 %
MCH: 30.2 pg (ref 26.0–34.0)
MCHC: 32.8 g/dL (ref 32.0–36.0)
MCV: 92 fL (ref 80–100)
MONO ABS: 0.5 x10 3/mm (ref 0.2–1.0)
Monocyte %: 12.1 %
NEUTROS ABS: 1.2 x10 3/mm — AB (ref 1.4–6.5)
Neutrophil %: 31.9 %
PLATELETS: 128 x10 3/mm — AB (ref 150–440)
RBC: 4.75 10*6/uL (ref 4.40–5.90)
RDW: 16.1 % — AB (ref 11.5–14.5)
WBC: 3.9 x10 3/mm (ref 3.8–10.6)

## 2013-11-14 LAB — MAGNESIUM: MAGNESIUM: 1.7 mg/dL — AB

## 2013-11-17 LAB — CEA: CEA: 4 ng/mL (ref 0.0–4.7)

## 2013-11-27 ENCOUNTER — Ambulatory Visit: Payer: Self-pay | Admitting: Oncology

## 2013-12-05 LAB — COMPREHENSIVE METABOLIC PANEL
ALK PHOS: 68 U/L
ALT: 51 U/L (ref 12–78)
Albumin: 4 g/dL (ref 3.4–5.0)
Anion Gap: 10 (ref 7–16)
BILIRUBIN TOTAL: 0.5 mg/dL (ref 0.2–1.0)
BUN: 16 mg/dL (ref 7–18)
CHLORIDE: 101 mmol/L (ref 98–107)
Calcium, Total: 9.7 mg/dL (ref 8.5–10.1)
Co2: 28 mmol/L (ref 21–32)
Creatinine: 1.43 mg/dL — ABNORMAL HIGH (ref 0.60–1.30)
EGFR (African American): 53 — ABNORMAL LOW
EGFR (Non-African Amer.): 46 — ABNORMAL LOW
GLUCOSE: 154 mg/dL — AB (ref 65–99)
Osmolality: 282 (ref 275–301)
POTASSIUM: 4.3 mmol/L (ref 3.5–5.1)
SGOT(AST): 45 U/L — ABNORMAL HIGH (ref 15–37)
Sodium: 139 mmol/L (ref 136–145)
Total Protein: 7.2 g/dL (ref 6.4–8.2)

## 2013-12-05 LAB — CBC CANCER CENTER
Basophil #: 0 x10 3/mm (ref 0.0–0.1)
Basophil %: 1 %
EOS PCT: 1.7 %
Eosinophil #: 0.1 x10 3/mm (ref 0.0–0.7)
HCT: 46.6 % (ref 40.0–52.0)
HGB: 15.6 g/dL (ref 13.0–18.0)
LYMPHS ABS: 2.5 x10 3/mm (ref 1.0–3.6)
LYMPHS PCT: 51.9 %
MCH: 31.4 pg (ref 26.0–34.0)
MCHC: 33.5 g/dL (ref 32.0–36.0)
MCV: 94 fL (ref 80–100)
MONOS PCT: 13.2 %
Monocyte #: 0.6 x10 3/mm (ref 0.2–1.0)
NEUTROS PCT: 32.2 %
Neutrophil #: 1.5 x10 3/mm (ref 1.4–6.5)
Platelet: 133 x10 3/mm — ABNORMAL LOW (ref 150–440)
RBC: 4.97 10*6/uL (ref 4.40–5.90)
RDW: 17.9 % — ABNORMAL HIGH (ref 11.5–14.5)
WBC: 4.7 x10 3/mm (ref 3.8–10.6)

## 2013-12-05 LAB — MAGNESIUM: Magnesium: 1.6 mg/dL — ABNORMAL LOW

## 2013-12-06 LAB — CEA: CEA: 5.1 ng/mL — AB (ref 0.0–4.7)

## 2013-12-28 ENCOUNTER — Ambulatory Visit: Payer: Self-pay | Admitting: Oncology

## 2013-12-29 LAB — CBC CANCER CENTER
BASOS PCT: 0.6 %
Basophil #: 0 x10 3/mm (ref 0.0–0.1)
EOS ABS: 0.1 x10 3/mm (ref 0.0–0.7)
EOS PCT: 1.8 %
HCT: 48 % (ref 40.0–52.0)
HGB: 16 g/dL (ref 13.0–18.0)
LYMPHS ABS: 1.9 x10 3/mm (ref 1.0–3.6)
LYMPHS PCT: 42.5 %
MCH: 32 pg (ref 26.0–34.0)
MCHC: 33.3 g/dL (ref 32.0–36.0)
MCV: 96 fL (ref 80–100)
MONO ABS: 0.6 x10 3/mm (ref 0.2–1.0)
Monocyte %: 12.5 %
Neutrophil #: 1.9 x10 3/mm (ref 1.4–6.5)
Neutrophil %: 42.6 %
PLATELETS: 124 x10 3/mm — AB (ref 150–440)
RBC: 4.99 10*6/uL (ref 4.40–5.90)
RDW: 18.4 % — ABNORMAL HIGH (ref 11.5–14.5)
WBC: 4.4 x10 3/mm (ref 3.8–10.6)

## 2013-12-29 LAB — COMPREHENSIVE METABOLIC PANEL
ALK PHOS: 53 U/L
ALT: 51 U/L (ref 12–78)
ANION GAP: 7 (ref 7–16)
AST: 46 U/L — AB (ref 15–37)
Albumin: 4.2 g/dL (ref 3.4–5.0)
BILIRUBIN TOTAL: 0.7 mg/dL (ref 0.2–1.0)
BUN: 16 mg/dL (ref 7–18)
CHLORIDE: 101 mmol/L (ref 98–107)
CREATININE: 1.41 mg/dL — AB (ref 0.60–1.30)
Calcium, Total: 10.3 mg/dL — ABNORMAL HIGH (ref 8.5–10.1)
Co2: 33 mmol/L — ABNORMAL HIGH (ref 21–32)
EGFR (African American): 54 — ABNORMAL LOW
EGFR (Non-African Amer.): 47 — ABNORMAL LOW
Glucose: 118 mg/dL — ABNORMAL HIGH (ref 65–99)
Osmolality: 284 (ref 275–301)
Potassium: 4.4 mmol/L (ref 3.5–5.1)
Sodium: 141 mmol/L (ref 136–145)
Total Protein: 7.6 g/dL (ref 6.4–8.2)

## 2013-12-29 LAB — MAGNESIUM: MAGNESIUM: 1.8 mg/dL

## 2013-12-30 LAB — CEA: CEA: 5.7 ng/mL — AB (ref 0.0–4.7)

## 2014-01-05 ENCOUNTER — Ambulatory Visit: Payer: Self-pay | Admitting: Oncology

## 2014-01-09 ENCOUNTER — Other Ambulatory Visit: Payer: Self-pay | Admitting: Oncology

## 2014-01-09 DIAGNOSIS — C7800 Secondary malignant neoplasm of unspecified lung: Secondary | ICD-10-CM

## 2014-01-09 DIAGNOSIS — C19 Malignant neoplasm of rectosigmoid junction: Secondary | ICD-10-CM

## 2014-01-18 LAB — CBC CANCER CENTER
BASOS ABS: 0 x10 3/mm (ref 0.0–0.1)
Basophil %: 0.5 %
Eosinophil #: 0 x10 3/mm (ref 0.0–0.7)
Eosinophil %: 0.1 %
HCT: 47.5 % (ref 40.0–52.0)
HGB: 15.8 g/dL (ref 13.0–18.0)
LYMPHS ABS: 1.2 x10 3/mm (ref 1.0–3.6)
LYMPHS PCT: 16.4 %
MCH: 31.9 pg (ref 26.0–34.0)
MCHC: 33.1 g/dL (ref 32.0–36.0)
MCV: 96 fL (ref 80–100)
Monocyte #: 0.1 x10 3/mm — ABNORMAL LOW (ref 0.2–1.0)
Monocyte %: 0.7 %
NEUTROS ABS: 5.8 x10 3/mm (ref 1.4–6.5)
Neutrophil %: 82.3 %
Platelet: 131 x10 3/mm — ABNORMAL LOW (ref 150–440)
RBC: 4.93 10*6/uL (ref 4.40–5.90)
RDW: 16.9 % — ABNORMAL HIGH (ref 11.5–14.5)
WBC: 7 x10 3/mm (ref 3.8–10.6)

## 2014-01-18 LAB — MAGNESIUM: MAGNESIUM: 1.6 mg/dL — AB

## 2014-01-18 LAB — COMPREHENSIVE METABOLIC PANEL
ALBUMIN: 4 g/dL (ref 3.4–5.0)
Alkaline Phosphatase: 54 U/L
Anion Gap: 14 (ref 7–16)
BILIRUBIN TOTAL: 0.6 mg/dL (ref 0.2–1.0)
BUN: 20 mg/dL — AB (ref 7–18)
CALCIUM: 9.7 mg/dL (ref 8.5–10.1)
CHLORIDE: 100 mmol/L (ref 98–107)
Co2: 23 mmol/L (ref 21–32)
Creatinine: 1.59 mg/dL — ABNORMAL HIGH (ref 0.60–1.30)
EGFR (African American): 47 — ABNORMAL LOW
EGFR (Non-African Amer.): 40 — ABNORMAL LOW
Glucose: 343 mg/dL — ABNORMAL HIGH (ref 65–99)
Osmolality: 290 (ref 275–301)
Potassium: 4.8 mmol/L (ref 3.5–5.1)
SGOT(AST): 44 U/L — ABNORMAL HIGH (ref 15–37)
SGPT (ALT): 55 U/L (ref 12–78)
Sodium: 137 mmol/L (ref 136–145)
Total Protein: 7.7 g/dL (ref 6.4–8.2)

## 2014-01-25 LAB — CBC CANCER CENTER
BASOS ABS: 0 x10 3/mm (ref 0.0–0.1)
Basophil %: 0.8 %
Eosinophil #: 0.1 x10 3/mm (ref 0.0–0.7)
Eosinophil %: 1.2 %
HCT: 43.1 % (ref 40.0–52.0)
HGB: 14.7 g/dL (ref 13.0–18.0)
LYMPHS ABS: 2 x10 3/mm (ref 1.0–3.6)
LYMPHS PCT: 32.3 %
MCH: 32 pg (ref 26.0–34.0)
MCHC: 34.1 g/dL (ref 32.0–36.0)
MCV: 94 fL (ref 80–100)
Monocyte #: 0.5 x10 3/mm (ref 0.2–1.0)
Monocyte %: 7.7 %
Neutrophil #: 3.5 x10 3/mm (ref 1.4–6.5)
Neutrophil %: 58 %
Platelet: 125 x10 3/mm — ABNORMAL LOW (ref 150–440)
RBC: 4.59 10*6/uL (ref 4.40–5.90)
RDW: 16.3 % — ABNORMAL HIGH (ref 11.5–14.5)
WBC: 6.1 x10 3/mm (ref 3.8–10.6)

## 2014-01-25 LAB — COMPREHENSIVE METABOLIC PANEL
Albumin: 3.6 g/dL (ref 3.4–5.0)
Alkaline Phosphatase: 45 U/L
Anion Gap: 5 — ABNORMAL LOW (ref 7–16)
BUN: 16 mg/dL (ref 7–18)
Bilirubin,Total: 0.3 mg/dL (ref 0.2–1.0)
Calcium, Total: 10.5 mg/dL — ABNORMAL HIGH (ref 8.5–10.1)
Chloride: 101 mmol/L (ref 98–107)
Co2: 34 mmol/L — ABNORMAL HIGH (ref 21–32)
Creatinine: 1.26 mg/dL (ref 0.60–1.30)
EGFR (African American): >60
EGFR (Non-African Amer.): 54 — ABNORMAL LOW
Glucose: 156 mg/dL — ABNORMAL HIGH (ref 65–99)
Osmolality: 284 (ref 275–301)
Potassium: 4.6 mmol/L (ref 3.5–5.1)
SGOT(AST): 39 U/L — ABNORMAL HIGH (ref 15–37)
SGPT (ALT): 73 U/L (ref 12–78)
Sodium: 140 mmol/L (ref 136–145)
Total Protein: 6.9 g/dL (ref 6.4–8.2)

## 2014-01-26 ENCOUNTER — Ambulatory Visit
Admission: RE | Admit: 2014-01-26 | Discharge: 2014-01-26 | Disposition: A | Discharge status: Home / Self Care | Payer: PRIVATE HEALTH INSURANCE | Source: Ambulatory Visit | Attending: Oncology | Admitting: Oncology

## 2014-01-26 DIAGNOSIS — C19 Malignant neoplasm of rectosigmoid junction: Secondary | ICD-10-CM

## 2014-01-26 DIAGNOSIS — C7800 Secondary malignant neoplasm of unspecified lung: Secondary | ICD-10-CM

## 2014-01-27 ENCOUNTER — Ambulatory Visit: Payer: Self-pay | Admitting: Oncology

## 2014-02-16 ENCOUNTER — Emergency Department: Payer: Self-pay | Admitting: Emergency Medicine

## 2014-02-16 LAB — CBC WITH DIFFERENTIAL/PLATELET
BASOS PCT: 0.5 %
Basophil #: 0.1 10*3/uL (ref 0.0–0.1)
EOS ABS: 0.1 10*3/uL (ref 0.0–0.7)
EOS PCT: 1.1 %
HCT: 48.4 % (ref 40.0–52.0)
HGB: 16.4 g/dL (ref 13.0–18.0)
Lymphocyte #: 1.9 10*3/uL (ref 1.0–3.6)
Lymphocyte %: 15.2 %
MCH: 31.9 pg (ref 26.0–34.0)
MCHC: 34 g/dL (ref 32.0–36.0)
MCV: 94 fL (ref 80–100)
Monocyte #: 1.6 x10 3/mm — ABNORMAL HIGH (ref 0.2–1.0)
Monocyte %: 13 %
NEUTROS ABS: 8.8 10*3/uL — AB (ref 1.4–6.5)
Neutrophil %: 70.2 %
Platelet: 134 10*3/uL — ABNORMAL LOW (ref 150–440)
RBC: 5.16 10*6/uL (ref 4.40–5.90)
RDW: 15.5 % — ABNORMAL HIGH (ref 11.5–14.5)
WBC: 12.5 10*3/uL — ABNORMAL HIGH (ref 3.8–10.6)

## 2014-02-16 LAB — COMPREHENSIVE METABOLIC PANEL
ALBUMIN: 3.6 g/dL (ref 3.4–5.0)
ALK PHOS: 60 U/L
ALT: 39 U/L (ref 12–78)
ANION GAP: 10 (ref 7–16)
BILIRUBIN TOTAL: 0.8 mg/dL (ref 0.2–1.0)
BUN: 18 mg/dL (ref 7–18)
CHLORIDE: 104 mmol/L (ref 98–107)
CO2: 25 mmol/L (ref 21–32)
CREATININE: 1.07 mg/dL (ref 0.60–1.30)
Calcium, Total: 9.6 mg/dL (ref 8.5–10.1)
EGFR (Non-African Amer.): >60
Glucose: 89 mg/dL (ref 65–99)
OSMOLALITY: 279 (ref 275–301)
POTASSIUM: 4.4 mmol/L (ref 3.5–5.1)
SGOT(AST): 40 U/L — ABNORMAL HIGH (ref 15–37)
SODIUM: 139 mmol/L (ref 136–145)
Total Protein: 7.4 g/dL (ref 6.4–8.2)

## 2014-02-16 LAB — MAGNESIUM: MAGNESIUM: 1.6 mg/dL — AB

## 2014-02-17 LAB — CEA: CEA: 12.6 ng/mL — ABNORMAL HIGH (ref 0.0–4.7)

## 2014-02-23 ENCOUNTER — Ambulatory Visit (INDEPENDENT_AMBULATORY_CARE_PROVIDER_SITE_OTHER): Payer: Medicare Other | Admitting: Podiatrist

## 2014-02-23 VITALS — BP 131/71 | HR 89 | Resp 16

## 2014-02-23 DIAGNOSIS — L84 Corns and callosities: Secondary | ICD-10-CM

## 2014-02-23 NOTE — Patient Instructions (Signed)

## 2014-02-23 NOTE — Progress Notes (Signed)
   Chief Complaint  Patient presents with  . Follow-up    i have a callus on both     HPI: Patient is 80 y.o. male who presents today for pain submetatarsal region bilateral feet and bilateral heels. The patient states that he is on oral chemotherapy agent now which causes calluses to formal bilateral feet. He states that he was in [place] and after 2 weeks of taking this chemotherapy agent his feet hurt so bad that he no longer walk. He did some research and found that this medication he is on does cause calluses and that they recommend you get debridement that you're podiatrist office every 2 weeks. He comes in today for request and debridement.     Physical Exam GENERAL APPEARANCE: Alert, conversant. Appropriately groomed. No acute distress.  VASCULAR: Pedal pulses palpable at 2/4 DP and PT bilateral.  Capillary refill time is immediate to all digits,   NEUROLOGIC: sensation is intact epicritically and protectively to 5.07 monofilament at 2/5 sites bilateral.  Generalized neuropathy is noted to bilateral feet. MUSCULOSKELETAL: acceptable muscle strength, tone and stability bilateral.  Intrinsic muscluature intact bilateral.   DERMATOLOGIC: Minimal thickening of skin to bilateral forefoot is noted. Bilateral heels do have calluses present which are also minimal in nature. The area of pinpoint concern has a very thin callus formed sub-met 2 right. Assessment: Callus bilateral feet  Plan: Very minimal debridement was accomplished today. Because there was such a slight amount of tissue to debride I did go a little too deep on the left foot and had to apply limiting. I then applied Silvadene cream as well. I buffed the area with a power bur and this seemed to work as well as anything. He will be seen back in 2 weeks and I will try to utilize a power bur versus utilizing a blade.

## 2014-02-27 ENCOUNTER — Ambulatory Visit: Payer: Self-pay | Admitting: Oncology

## 2014-03-02 ENCOUNTER — Ambulatory Visit: Payer: Self-pay | Admitting: Oncology

## 2014-03-02 LAB — CBC CANCER CENTER
BASOS ABS: 0.1 x10 3/mm (ref 0.0–0.1)
Basophil %: 0.7 %
Eosinophil #: 0.2 x10 3/mm (ref 0.0–0.7)
Eosinophil %: 1.9 %
HCT: 48.7 % (ref 40.0–52.0)
HGB: 16.1 g/dL (ref 13.0–18.0)
LYMPHS PCT: 28 %
Lymphocyte #: 2.3 x10 3/mm (ref 1.0–3.6)
MCH: 31.3 pg (ref 26.0–34.0)
MCHC: 33 g/dL (ref 32.0–36.0)
MCV: 95 fL (ref 80–100)
MONO ABS: 0.8 x10 3/mm (ref 0.2–1.0)
Monocyte %: 9.5 %
Neutrophil #: 5 x10 3/mm (ref 1.4–6.5)
Neutrophil %: 59.9 %
PLATELETS: 183 x10 3/mm (ref 150–440)
RBC: 5.14 10*6/uL (ref 4.40–5.90)
RDW: 15.8 % — AB (ref 11.5–14.5)
WBC: 8.3 x10 3/mm (ref 3.8–10.6)

## 2014-03-02 LAB — COMPREHENSIVE METABOLIC PANEL
ALBUMIN: 3.8 g/dL (ref 3.4–5.0)
ALK PHOS: 55 U/L
ANION GAP: 8 (ref 7–16)
AST: 37 U/L (ref 15–37)
BILIRUBIN TOTAL: 0.5 mg/dL (ref 0.2–1.0)
BUN: 17 mg/dL (ref 7–18)
CO2: 31 mmol/L (ref 21–32)
Calcium, Total: 10.7 mg/dL — ABNORMAL HIGH (ref 8.5–10.1)
Chloride: 100 mmol/L (ref 98–107)
Creatinine: 1.23 mg/dL (ref 0.60–1.30)
EGFR (African American): >60
EGFR (Non-African Amer.): 55 — ABNORMAL LOW
GLUCOSE: 122 mg/dL — AB (ref 65–99)
Osmolality: 280 (ref 275–301)
POTASSIUM: 4.8 mmol/L (ref 3.5–5.1)
SGPT (ALT): 52 U/L (ref 12–78)
SODIUM: 139 mmol/L (ref 136–145)
Total Protein: 7.8 g/dL (ref 6.4–8.2)

## 2014-03-02 LAB — MAGNESIUM: Magnesium: 1.8 mg/dL

## 2014-03-03 ENCOUNTER — Ambulatory Visit: Payer: Self-pay | Admitting: Podiatry

## 2014-03-03 LAB — CEA: CEA: 9.1 ng/mL — ABNORMAL HIGH (ref 0.0–4.7)

## 2014-03-09 ENCOUNTER — Ambulatory Visit: Payer: Medicare Other | Admitting: Podiatrist

## 2014-03-15 LAB — CBC CANCER CENTER
Basophil #: 0.1 x10 3/mm (ref 0.0–0.1)
Basophil %: 0.9 %
EOS ABS: 0.2 x10 3/mm (ref 0.0–0.7)
Eosinophil %: 1.6 %
HCT: 48.5 % (ref 40.0–52.0)
HGB: 16.6 g/dL (ref 13.0–18.0)
Lymphocyte #: 2.6 x10 3/mm (ref 1.0–3.6)
Lymphocyte %: 25.9 %
MCH: 31.7 pg (ref 26.0–34.0)
MCHC: 34.2 g/dL (ref 32.0–36.0)
MCV: 93 fL (ref 80–100)
MONO ABS: 1.1 x10 3/mm — AB (ref 0.2–1.0)
Monocyte %: 11.1 %
Neutrophil #: 6 x10 3/mm (ref 1.4–6.5)
Neutrophil %: 60.5 %
Platelet: 150 x10 3/mm (ref 150–440)
RBC: 5.23 10*6/uL (ref 4.40–5.90)
RDW: 14.9 % — ABNORMAL HIGH (ref 11.5–14.5)
WBC: 10 x10 3/mm (ref 3.8–10.6)

## 2014-03-15 LAB — COMPREHENSIVE METABOLIC PANEL
ALK PHOS: 66 U/L
ALT: 46 U/L (ref 12–78)
ANION GAP: 8 (ref 7–16)
AST: 41 U/L — AB (ref 15–37)
Albumin: 3.8 g/dL (ref 3.4–5.0)
BUN: 15 mg/dL (ref 7–18)
Bilirubin,Total: 0.7 mg/dL (ref 0.2–1.0)
CHLORIDE: 103 mmol/L (ref 98–107)
CO2: 29 mmol/L (ref 21–32)
Calcium, Total: 10.1 mg/dL (ref 8.5–10.1)
Creatinine: 1.25 mg/dL (ref 0.60–1.30)
EGFR (African American): >60
EGFR (Non-African Amer.): 54 — ABNORMAL LOW
GLUCOSE: 106 mg/dL — AB (ref 65–99)
Osmolality: 281 (ref 275–301)
Potassium: 4.4 mmol/L (ref 3.5–5.1)
SODIUM: 140 mmol/L (ref 136–145)
TOTAL PROTEIN: 7.9 g/dL (ref 6.4–8.2)

## 2014-03-16 LAB — CEA: CEA: 10.1 ng/mL — AB (ref 0.0–4.7)

## 2014-03-23 ENCOUNTER — Ambulatory Visit (INDEPENDENT_AMBULATORY_CARE_PROVIDER_SITE_OTHER): Payer: Medicare Other | Admitting: Podiatrist

## 2014-03-23 VITALS — BP 113/60 | HR 86 | Resp 16

## 2014-03-23 DIAGNOSIS — L84 Corns and callosities: Secondary | ICD-10-CM

## 2014-03-23 NOTE — Patient Instructions (Signed)
The moisturizer I recommend is CETAPHIL CREAM-- it comes in a tub or a crock.  Apply at night and cover with socks

## 2014-03-23 NOTE — Progress Notes (Signed)
**Note De-Identified Genette Huertas Obfuscation** HPI: Patient is 80 y.o. male who presents today for followup of calluses and pain submetatarsal region bilateral feet and bilateral heels. The patient states that he is on oral chemotherapy agent now which causes calluses to formal bilateral feet. He has been using an at-home pumice stone and foot scrubbed and states that he's been taking care of the calluses on his own. He wants to make sure he's doing all right and that his feet look good.   Physical Exam  GENERAL APPEARANCE: Alert, conversant. Appropriately groomed. No acute distress.  VASCULAR: Pedal pulses palpable at 2/4 DP and PT bilateral. Capillary refill time is immediate to all digits,  NEUROLOGIC: sensation is intact epicritically and protectively to 5.07 monofilament at 2/5 sites bilateral. Generalized neuropathy is noted to bilateral feet.  MUSCULOSKELETAL: acceptable muscle strength, tone and stability bilateral. Intrinsic muscluature intact bilateral.  DERMATOLOGIC: Minimal thickening of skin to bilateral forefoot is noted. Bilateral heels do have very mild calluses present   Assessment: Callus bilateral feet   Plan: Encouraged continued use of his at-home care. Recommended Cetaphil cream. He'll be seen back as needed.

## 2014-03-29 ENCOUNTER — Ambulatory Visit: Payer: Self-pay | Admitting: Oncology

## 2014-04-13 LAB — CBC CANCER CENTER
BASOS ABS: 0 x10 3/mm (ref 0.0–0.1)
Basophil %: 0.6 %
EOS ABS: 0.1 x10 3/mm (ref 0.0–0.7)
EOS PCT: 1.7 %
HCT: 44.9 % (ref 40.0–52.0)
HGB: 14.9 g/dL (ref 13.0–18.0)
LYMPHS ABS: 1.9 x10 3/mm (ref 1.0–3.6)
Lymphocyte %: 26.3 %
MCH: 30.8 pg (ref 26.0–34.0)
MCHC: 33.2 g/dL (ref 32.0–36.0)
MCV: 93 fL (ref 80–100)
MONOS PCT: 11 %
Monocyte #: 0.8 x10 3/mm (ref 0.2–1.0)
NEUTROS ABS: 4.3 x10 3/mm (ref 1.4–6.5)
NEUTROS PCT: 60.4 %
PLATELETS: 155 x10 3/mm (ref 150–440)
RBC: 4.85 10*6/uL (ref 4.40–5.90)
RDW: 15 % — ABNORMAL HIGH (ref 11.5–14.5)
WBC: 7.1 x10 3/mm (ref 3.8–10.6)

## 2014-04-13 LAB — COMPREHENSIVE METABOLIC PANEL
ALK PHOS: 50 U/L
ALT: 41 U/L (ref 12–78)
ANION GAP: 8 (ref 7–16)
Albumin: 3.4 g/dL (ref 3.4–5.0)
BUN: 12 mg/dL (ref 7–18)
Bilirubin,Total: 0.5 mg/dL (ref 0.2–1.0)
CO2: 30 mmol/L (ref 21–32)
Calcium, Total: 10.4 mg/dL — ABNORMAL HIGH (ref 8.5–10.1)
Chloride: 103 mmol/L (ref 98–107)
Creatinine: 1.32 mg/dL — ABNORMAL HIGH (ref 0.60–1.30)
EGFR (African American): 59 — ABNORMAL LOW
EGFR (Non-African Amer.): 51 — ABNORMAL LOW
Glucose: 155 mg/dL — ABNORMAL HIGH (ref 65–99)
OSMOLALITY: 284 (ref 275–301)
POTASSIUM: 4.8 mmol/L (ref 3.5–5.1)
SGOT(AST): 35 U/L (ref 15–37)
Sodium: 141 mmol/L (ref 136–145)
TOTAL PROTEIN: 7.1 g/dL (ref 6.4–8.2)

## 2014-04-14 LAB — CEA: CEA: 12.6 ng/mL — ABNORMAL HIGH (ref 0.0–4.7)

## 2014-04-29 ENCOUNTER — Ambulatory Visit: Payer: Self-pay | Admitting: Oncology

## 2014-05-08 LAB — COMPREHENSIVE METABOLIC PANEL
ALK PHOS: 62 U/L
ANION GAP: 10 (ref 7–16)
AST: 41 U/L — AB (ref 15–37)
Albumin: 3.3 g/dL — ABNORMAL LOW (ref 3.4–5.0)
BILIRUBIN TOTAL: 0.5 mg/dL (ref 0.2–1.0)
BUN: 13 mg/dL (ref 7–18)
CO2: 27 mmol/L (ref 21–32)
Calcium, Total: 9.4 mg/dL (ref 8.5–10.1)
Chloride: 103 mmol/L (ref 98–107)
Creatinine: 1.48 mg/dL — ABNORMAL HIGH (ref 0.60–1.30)
EGFR (Non-African Amer.): 44 — ABNORMAL LOW
GFR CALC AF AMER: 51 — AB
Glucose: 143 mg/dL — ABNORMAL HIGH (ref 65–99)
Osmolality: 282 (ref 275–301)
POTASSIUM: 4.2 mmol/L (ref 3.5–5.1)
SGPT (ALT): 38 U/L
Sodium: 140 mmol/L (ref 136–145)
Total Protein: 7.2 g/dL (ref 6.4–8.2)

## 2014-05-08 LAB — CBC CANCER CENTER
Basophil #: 0.1 x10 3/mm (ref 0.0–0.1)
Basophil %: 0.7 %
EOS PCT: 1.3 %
Eosinophil #: 0.1 x10 3/mm (ref 0.0–0.7)
HCT: 45.8 % (ref 40.0–52.0)
HGB: 15.3 g/dL (ref 13.0–18.0)
LYMPHS ABS: 1.6 x10 3/mm (ref 1.0–3.6)
Lymphocyte %: 20 %
MCH: 30.2 pg (ref 26.0–34.0)
MCHC: 33.3 g/dL (ref 32.0–36.0)
MCV: 91 fL (ref 80–100)
Monocyte #: 0.6 x10 3/mm (ref 0.2–1.0)
Monocyte %: 7 %
NEUTROS PCT: 71 %
Neutrophil #: 5.8 x10 3/mm (ref 1.4–6.5)
Platelet: 150 x10 3/mm (ref 150–440)
RBC: 5.05 10*6/uL (ref 4.40–5.90)
RDW: 15.3 % — ABNORMAL HIGH (ref 11.5–14.5)
WBC: 8.2 x10 3/mm (ref 3.8–10.6)

## 2014-05-10 LAB — CEA: CEA: 21.5 ng/mL — ABNORMAL HIGH (ref 0.0–4.7)

## 2014-05-15 ENCOUNTER — Ambulatory Visit: Payer: Self-pay | Admitting: Oncology

## 2014-05-16 LAB — COMPREHENSIVE METABOLIC PANEL
ALT: 32 U/L
Albumin: 3.3 g/dL — ABNORMAL LOW (ref 3.4–5.0)
Alkaline Phosphatase: 60 U/L
Anion Gap: 13 (ref 7–16)
BILIRUBIN TOTAL: 0.5 mg/dL (ref 0.2–1.0)
BUN: 12 mg/dL (ref 7–18)
CALCIUM: 9.1 mg/dL (ref 8.5–10.1)
Chloride: 101 mmol/L (ref 98–107)
Co2: 26 mmol/L (ref 21–32)
Creatinine: 1.47 mg/dL — ABNORMAL HIGH (ref 0.60–1.30)
GFR CALC AF AMER: 51 — AB
GFR CALC NON AF AMER: 44 — AB
Glucose: 149 mg/dL — ABNORMAL HIGH (ref 65–99)
Osmolality: 282 (ref 275–301)
Potassium: 4.3 mmol/L (ref 3.5–5.1)
SGOT(AST): 32 U/L (ref 15–37)
SODIUM: 140 mmol/L (ref 136–145)
TOTAL PROTEIN: 7.3 g/dL (ref 6.4–8.2)

## 2014-05-16 LAB — CBC CANCER CENTER
Basophil #: 0 x10 3/mm (ref 0.0–0.1)
Basophil %: 0.6 %
EOS ABS: 0.3 x10 3/mm (ref 0.0–0.7)
Eosinophil %: 3.5 %
HCT: 44.7 % (ref 40.0–52.0)
HGB: 14.7 g/dL (ref 13.0–18.0)
LYMPHS PCT: 21.9 %
Lymphocyte #: 1.8 x10 3/mm (ref 1.0–3.6)
MCH: 30 pg (ref 26.0–34.0)
MCHC: 33 g/dL (ref 32.0–36.0)
MCV: 91 fL (ref 80–100)
Monocyte #: 0.8 x10 3/mm (ref 0.2–1.0)
Monocyte %: 9.3 %
Neutrophil #: 5.5 x10 3/mm (ref 1.4–6.5)
Neutrophil %: 64.7 %
PLATELETS: 192 x10 3/mm (ref 150–440)
RBC: 4.91 10*6/uL (ref 4.40–5.90)
RDW: 15.6 % — ABNORMAL HIGH (ref 11.5–14.5)
WBC: 8.4 x10 3/mm (ref 3.8–10.6)

## 2014-05-30 ENCOUNTER — Ambulatory Visit: Payer: Self-pay | Admitting: Oncology

## 2014-06-06 LAB — COMPREHENSIVE METABOLIC PANEL
ALBUMIN: 3.3 g/dL — AB (ref 3.4–5.0)
ALK PHOS: 75 U/L
ALT: 34 U/L
Anion Gap: 6 — ABNORMAL LOW (ref 7–16)
BUN: 12 mg/dL (ref 7–18)
Bilirubin,Total: 0.5 mg/dL (ref 0.2–1.0)
CO2: 30 mmol/L (ref 21–32)
Calcium, Total: 8.7 mg/dL (ref 8.5–10.1)
Chloride: 103 mmol/L (ref 98–107)
Creatinine: 1.22 mg/dL (ref 0.60–1.30)
EGFR (African American): >60
GFR CALC NON AF AMER: 56 — AB
GLUCOSE: 128 mg/dL — AB (ref 65–99)
OSMOLALITY: 279 (ref 275–301)
POTASSIUM: 3.9 mmol/L (ref 3.5–5.1)
SGOT(AST): 36 U/L (ref 15–37)
SODIUM: 139 mmol/L (ref 136–145)
Total Protein: 6.7 g/dL (ref 6.4–8.2)

## 2014-06-06 LAB — CBC CANCER CENTER
Basophil #: 0 x10 3/mm (ref 0.0–0.1)
Basophil %: 0.9 %
EOS ABS: 0.2 x10 3/mm (ref 0.0–0.7)
Eosinophil %: 3.5 %
HCT: 44 % (ref 40.0–52.0)
HGB: 14.4 g/dL (ref 13.0–18.0)
LYMPHS ABS: 1.7 x10 3/mm (ref 1.0–3.6)
Lymphocyte %: 31.9 %
MCH: 30.5 pg (ref 26.0–34.0)
MCHC: 32.8 g/dL (ref 32.0–36.0)
MCV: 93 fL (ref 80–100)
MONO ABS: 0.8 x10 3/mm (ref 0.2–1.0)
Monocyte %: 15.9 %
Neutrophil #: 2.5 x10 3/mm (ref 1.4–6.5)
Neutrophil %: 47.8 %
Platelet: 132 x10 3/mm — ABNORMAL LOW (ref 150–440)
RBC: 4.73 10*6/uL (ref 4.40–5.90)
RDW: 17.3 % — ABNORMAL HIGH (ref 11.5–14.5)
WBC: 5.2 x10 3/mm (ref 3.8–10.6)

## 2014-06-06 LAB — MAGNESIUM: Magnesium: 1.7 mg/dL — ABNORMAL LOW

## 2014-06-07 LAB — CEA: CEA: 28.5 ng/mL — AB (ref 0.0–4.7)

## 2014-06-16 ENCOUNTER — Ambulatory Visit: Payer: Medicare Other | Admitting: Podiatry

## 2014-06-16 DIAGNOSIS — M79609 Pain in unspecified limb: Secondary | ICD-10-CM

## 2014-06-16 DIAGNOSIS — L03039 Cellulitis of unspecified toe: Secondary | ICD-10-CM

## 2014-06-16 NOTE — Progress Notes (Signed)
Subjective:     Patient ID: Edward Bradley, male   DOB: 1930-10-26, 80 y.o.   MRN: [id number]  HPI patient has been on several chemotherapy drugs and has a chronic ingrown toenail right hallux lateral border that's painful and he cannot take care of. The left is also moderately infected but is not hurting him   Review of Systems     Objective:   Physical Exam Neurovascular status unchanged with paronychia infection right hallux lateral border and mild changes in the left    Assessment:     Paronychia of the right hallux    Plan:     H&P and reviewed condition. No proximal edema erythema drainage noted it is all localized and today I infiltrated 60 mg Xylocaine Marcaine mixture remove the lateral border remove proud flesh and abscess tissue and allowed channel for drainage. Reappoint her recheck

## 2014-06-16 NOTE — Patient Instructions (Signed)

## 2014-06-19 LAB — CBC CANCER CENTER
Basophil #: 0 x10 3/mm (ref 0.0–0.1)
Basophil %: 0.4 %
EOS ABS: 0.1 x10 3/mm (ref 0.0–0.7)
EOS PCT: 2.3 %
HCT: 46.6 % (ref 40.0–52.0)
HGB: 15.3 g/dL (ref 13.0–18.0)
LYMPHS ABS: 1.7 x10 3/mm (ref 1.0–3.6)
LYMPHS PCT: 27 %
MCH: 31 pg (ref 26.0–34.0)
MCHC: 32.8 g/dL (ref 32.0–36.0)
MCV: 94 fL (ref 80–100)
MONO ABS: 0.6 x10 3/mm (ref 0.2–1.0)
Monocyte %: 9.6 %
NEUTROS ABS: 3.9 x10 3/mm (ref 1.4–6.5)
NEUTROS PCT: 60.7 %
Platelet: 158 x10 3/mm (ref 150–440)
RBC: 4.94 10*6/uL (ref 4.40–5.90)
RDW: 17 % — ABNORMAL HIGH (ref 11.5–14.5)
WBC: 6.4 x10 3/mm (ref 3.8–10.6)

## 2014-06-19 LAB — COMPREHENSIVE METABOLIC PANEL
ANION GAP: 8 (ref 7–16)
Albumin: 3.6 g/dL (ref 3.4–5.0)
Alkaline Phosphatase: 82 U/L
BILIRUBIN TOTAL: 0.5 mg/dL (ref 0.2–1.0)
BUN: 11 mg/dL (ref 7–18)
CHLORIDE: 107 mmol/L (ref 98–107)
Calcium, Total: 9.6 mg/dL (ref 8.5–10.1)
Co2: 30 mmol/L (ref 21–32)
Creatinine: 1.19 mg/dL (ref 0.60–1.30)
EGFR (African American): >60
EGFR (Non-African Amer.): 57 — ABNORMAL LOW
GLUCOSE: 146 mg/dL — AB (ref 65–99)
Osmolality: 291 (ref 275–301)
Potassium: 4.8 mmol/L (ref 3.5–5.1)
SGOT(AST): 31 U/L (ref 15–37)
SGPT (ALT): 33 U/L
SODIUM: 145 mmol/L (ref 136–145)
Total Protein: 7.1 g/dL (ref 6.4–8.2)

## 2014-06-19 LAB — MAGNESIUM: MAGNESIUM: 1.7 mg/dL — AB

## 2014-06-20 LAB — CEA: CEA: 36.2 ng/mL — ABNORMAL HIGH (ref 0.0–4.7)

## 2014-06-23 ENCOUNTER — Ambulatory Visit (INDEPENDENT_AMBULATORY_CARE_PROVIDER_SITE_OTHER): Payer: Medicare Other | Admitting: Podiatry

## 2014-06-23 VITALS — BP 127/81 | HR 76 | Resp 16

## 2014-06-23 DIAGNOSIS — L03039 Cellulitis of unspecified toe: Secondary | ICD-10-CM | POA: Diagnosis not present

## 2014-06-23 NOTE — Progress Notes (Signed)
Subjective:     Patient ID: Edward Bradley, male   DOB: [birth year], 80 y.o.   MRN: [id number]  HPI patient states the toenail on my left big toe has started to bother me on both sides and I should have fixed at last week   Review of Systems     Objective:   Physical Exam Neurovascular status unchanged with incurvated left hallux medial lateral border with mild/moderate drainage noted that's localized    Assessment:     Paronychia infection left hallux nail    Plan:     Infiltrated 60 mg Xylocaine Marcaine mixture remove the medial and lateral borders proud flesh and applied sterile dressing. Instructed on soaks

## 2014-06-29 ENCOUNTER — Ambulatory Visit: Payer: Self-pay | Admitting: Oncology

## 2014-07-03 LAB — CBC CANCER CENTER
Basophil #: 0 x10 3/mm (ref 0.0–0.1)
Basophil %: 0.5 %
Eosinophil #: 0.2 x10 3/mm (ref 0.0–0.7)
Eosinophil %: 1.9 %
HCT: 43.7 % (ref 40.0–52.0)
HGB: 14.5 g/dL (ref 13.0–18.0)
Lymphocyte #: 1.5 x10 3/mm (ref 1.0–3.6)
Lymphocyte %: 19.3 %
MCH: 31.7 pg (ref 26.0–34.0)
MCHC: 33.3 g/dL (ref 32.0–36.0)
MCV: 95 fL (ref 80–100)
MONOS PCT: 12.1 %
Monocyte #: 1 x10 3/mm (ref 0.2–1.0)
Neutrophil #: 5.2 x10 3/mm (ref 1.4–6.5)
Neutrophil %: 66.2 %
Platelet: 163 x10 3/mm (ref 150–440)
RBC: 4.58 10*6/uL (ref 4.40–5.90)
RDW: 18.6 % — AB (ref 11.5–14.5)
WBC: 7.9 x10 3/mm (ref 3.8–10.6)

## 2014-07-10 ENCOUNTER — Emergency Department: Payer: Self-pay | Admitting: Emergency Medicine

## 2014-07-10 LAB — CBC WITH DIFFERENTIAL/PLATELET
BASOS ABS: 0 10*3/uL (ref 0.0–0.1)
BASOS PCT: 0.4 %
EOS PCT: 1.9 %
Eosinophil #: 0.2 10*3/uL (ref 0.0–0.7)
HCT: 42.1 % (ref 40.0–52.0)
HGB: 13.9 g/dL (ref 13.0–18.0)
Lymphocyte #: 1.4 10*3/uL (ref 1.0–3.6)
Lymphocyte %: 13.6 %
MCH: 31.4 pg (ref 26.0–34.0)
MCHC: 33 g/dL (ref 32.0–36.0)
MCV: 95 fL (ref 80–100)
Monocyte #: 1.4 x10 3/mm — ABNORMAL HIGH (ref 0.2–1.0)
Monocyte %: 14.4 %
Neutrophil #: 7 10*3/uL — ABNORMAL HIGH (ref 1.4–6.5)
Neutrophil %: 69.7 %
Platelet: 231 10*3/uL (ref 150–440)
RBC: 4.44 10*6/uL (ref 4.40–5.90)
RDW: 18.6 % — ABNORMAL HIGH (ref 11.5–14.5)
WBC: 10 10*3/uL (ref 3.8–10.6)

## 2014-07-10 LAB — URINALYSIS, COMPLETE
Bacteria: NONE SEEN
Bilirubin,UR: NEGATIVE
Blood: NEGATIVE
GLUCOSE, UR: NEGATIVE mg/dL (ref 0–75)
KETONE: NEGATIVE
Leukocyte Esterase: NEGATIVE
Nitrite: NEGATIVE
Ph: 8 (ref 4.5–8.0)
Protein: NEGATIVE
RBC, UR: NONE SEEN /HPF (ref 0–5)
SPECIFIC GRAVITY: 1.004 (ref 1.003–1.030)
Squamous Epithelial: <1
WBC UR: <1 /HPF (ref 0–5)

## 2014-07-10 LAB — COMPREHENSIVE METABOLIC PANEL
ALK PHOS: 87 U/L
ANION GAP: 9 (ref 7–16)
Albumin: 3.4 g/dL (ref 3.4–5.0)
BUN: 11 mg/dL (ref 7–18)
Bilirubin,Total: 0.6 mg/dL (ref 0.2–1.0)
CREATININE: 1 mg/dL (ref 0.60–1.30)
Calcium, Total: 8.8 mg/dL (ref 8.5–10.1)
Chloride: 104 mmol/L (ref 98–107)
Co2: 26 mmol/L (ref 21–32)
Glucose: 94 mg/dL (ref 65–99)
Osmolality: 277 (ref 275–301)
POTASSIUM: 3.8 mmol/L (ref 3.5–5.1)
SGOT(AST): 52 U/L — ABNORMAL HIGH (ref 15–37)
SGPT (ALT): 26 U/L
Sodium: 139 mmol/L (ref 136–145)
TOTAL PROTEIN: 7.1 g/dL (ref 6.4–8.2)

## 2014-07-10 LAB — LIPASE, BLOOD: Lipase: 181 U/L (ref 73–393)

## 2014-07-14 ENCOUNTER — Inpatient Hospital Stay: Payer: Self-pay | Admitting: Specialist

## 2014-07-14 LAB — COMPREHENSIVE METABOLIC PANEL
ALK PHOS: 76 U/L
ANION GAP: 8 (ref 7–16)
Albumin: 3.3 g/dL — ABNORMAL LOW (ref 3.4–5.0)
BUN: 9 mg/dL (ref 7–18)
Bilirubin,Total: 0.7 mg/dL (ref 0.2–1.0)
CALCIUM: 9.3 mg/dL (ref 8.5–10.1)
CREATININE: 1.02 mg/dL (ref 0.60–1.30)
Chloride: 101 mmol/L (ref 98–107)
Co2: 28 mmol/L (ref 21–32)
EGFR (African American): >60
EGFR (Non-African Amer.): >60
GLUCOSE: 105 mg/dL — AB (ref 65–99)
OSMOLALITY: 273 (ref 275–301)
POTASSIUM: 3.8 mmol/L (ref 3.5–5.1)
SGOT(AST): 38 U/L — ABNORMAL HIGH (ref 15–37)
SGPT (ALT): 20 U/L
Sodium: 137 mmol/L (ref 136–145)
Total Protein: 7.3 g/dL (ref 6.4–8.2)

## 2014-07-14 LAB — CBC
HCT: 42.3 % (ref 40.0–52.0)
HGB: 13.5 g/dL (ref 13.0–18.0)
MCH: 30.8 pg (ref 26.0–34.0)
MCHC: 32 g/dL (ref 32.0–36.0)
MCV: 96 fL (ref 80–100)
PLATELETS: 281 10*3/uL (ref 150–440)
RBC: 4.4 10*6/uL (ref 4.40–5.90)
RDW: 17.9 % — ABNORMAL HIGH (ref 11.5–14.5)
WBC: 7.5 10*3/uL (ref 3.8–10.6)

## 2014-07-14 LAB — URINALYSIS, COMPLETE
BLOOD: NEGATIVE
Bacteria: NONE SEEN
Bilirubin,UR: NEGATIVE
Glucose,UR: NEGATIVE mg/dL (ref 0–75)
KETONE: NEGATIVE
LEUKOCYTE ESTERASE: NEGATIVE
NITRITE: NEGATIVE
Ph: 7 (ref 4.5–8.0)
Protein: NEGATIVE
RBC,UR: <1 /HPF (ref 0–5)
SQUAMOUS EPITHELIAL: NONE SEEN
Specific Gravity: 1.003 (ref 1.003–1.030)

## 2014-07-14 LAB — PROTIME-INR
INR: 1
PROTHROMBIN TIME: 12.6 s (ref 11.5–14.7)

## 2014-07-14 LAB — TROPONIN I: Troponin-I: <0.02 ng/mL

## 2014-07-14 LAB — LIPASE, BLOOD: LIPASE: 69 U/L — AB (ref 73–393)

## 2014-07-14 LAB — APTT: Activated PTT: 28.1 secs (ref 23.6–35.9)

## 2014-07-18 LAB — CBC CANCER CENTER
BASOS ABS: 0 x10 3/mm (ref 0.0–0.1)
Basophil %: 0.6 %
EOS ABS: 0.2 x10 3/mm (ref 0.0–0.7)
Eosinophil %: 3.5 %
HCT: 40.6 % (ref 40.0–52.0)
HGB: 13.3 g/dL (ref 13.0–18.0)
Lymphocyte #: 1.1 x10 3/mm (ref 1.0–3.6)
Lymphocyte %: 21.6 %
MCH: 31.2 pg (ref 26.0–34.0)
MCHC: 32.8 g/dL (ref 32.0–36.0)
MCV: 95 fL (ref 80–100)
Monocyte #: 0.3 x10 3/mm (ref 0.2–1.0)
Monocyte %: 6.5 %
NEUTROS PCT: 67.8 %
Neutrophil #: 3.5 x10 3/mm (ref 1.4–6.5)
PLATELETS: 238 x10 3/mm (ref 150–440)
RBC: 4.26 10*6/uL — ABNORMAL LOW (ref 4.40–5.90)
RDW: 17.5 % — ABNORMAL HIGH (ref 11.5–14.5)
WBC: 5.1 x10 3/mm (ref 3.8–10.6)

## 2014-07-24 LAB — COMPREHENSIVE METABOLIC PANEL
ALBUMIN: 4 g/dL (ref 3.4–5.0)
AST: 39 U/L — AB (ref 15–37)
Alkaline Phosphatase: 91 U/L
Anion Gap: 12 (ref 7–16)
BILIRUBIN TOTAL: 0.7 mg/dL (ref 0.2–1.0)
BUN: 13 mg/dL (ref 7–18)
CREATININE: 1.33 mg/dL — AB (ref 0.60–1.30)
Calcium, Total: 9.8 mg/dL (ref 8.5–10.1)
Chloride: 100 mmol/L (ref 98–107)
Co2: 26 mmol/L (ref 21–32)
EGFR (African American): >60
GFR CALC NON AF AMER: 55 — AB
GLUCOSE: 113 mg/dL — AB (ref 65–99)
Osmolality: 277 (ref 275–301)
POTASSIUM: 4.2 mmol/L (ref 3.5–5.1)
SGPT (ALT): 32 U/L
SODIUM: 138 mmol/L (ref 136–145)
Total Protein: 8 g/dL (ref 6.4–8.2)

## 2014-07-24 LAB — CBC CANCER CENTER
Basophil #: 0.1 x10 3/mm (ref 0.0–0.1)
Basophil %: 1 %
EOS ABS: 0.1 x10 3/mm (ref 0.0–0.7)
Eosinophil %: 1.5 %
HCT: 44.3 % (ref 40.0–52.0)
HGB: 14.7 g/dL (ref 13.0–18.0)
LYMPHS ABS: 0.9 x10 3/mm — AB (ref 1.0–3.6)
Lymphocyte %: 17.1 %
MCH: 31.3 pg (ref 26.0–34.0)
MCHC: 33.2 g/dL (ref 32.0–36.0)
MCV: 94 fL (ref 80–100)
Monocyte #: 0.2 x10 3/mm (ref 0.2–1.0)
Monocyte %: 3.5 %
Neutrophil #: 4.2 x10 3/mm (ref 1.4–6.5)
Neutrophil %: 76.9 %
PLATELETS: 149 x10 3/mm — AB (ref 150–440)
RBC: 4.69 10*6/uL (ref 4.40–5.90)
RDW: 17.3 % — ABNORMAL HIGH (ref 11.5–14.5)
WBC: 5.4 x10 3/mm (ref 3.8–10.6)

## 2014-07-24 LAB — MAGNESIUM: Magnesium: 2.3 mg/dL

## 2014-07-30 ENCOUNTER — Ambulatory Visit: Payer: Self-pay | Admitting: Oncology

## 2014-08-03 LAB — COMPREHENSIVE METABOLIC PANEL
ALK PHOS: 95 U/L
Albumin: 3 g/dL — ABNORMAL LOW (ref 3.4–5.0)
Anion Gap: 8 (ref 7–16)
BUN: 13 mg/dL (ref 7–18)
Bilirubin,Total: 0.4 mg/dL (ref 0.2–1.0)
Calcium, Total: 9.5 mg/dL (ref 8.5–10.1)
Chloride: 100 mmol/L (ref 98–107)
Co2: 29 mmol/L (ref 21–32)
Creatinine: 1.11 mg/dL (ref 0.60–1.30)
EGFR (African American): >60
EGFR (Non-African Amer.): >60
GLUCOSE: 110 mg/dL — AB (ref 65–99)
OSMOLALITY: 275 (ref 275–301)
POTASSIUM: 3.8 mmol/L (ref 3.5–5.1)
SGOT(AST): 37 U/L (ref 15–37)
SGPT (ALT): 37 U/L
Sodium: 137 mmol/L (ref 136–145)
TOTAL PROTEIN: 6.9 g/dL (ref 6.4–8.2)

## 2014-08-03 LAB — CBC CANCER CENTER
BASOS ABS: 0 x10 3/mm (ref 0.0–0.1)
Basophil %: 0.3 %
Eosinophil #: 0 x10 3/mm (ref 0.0–0.7)
Eosinophil %: 0.5 %
HCT: 39.8 % — ABNORMAL LOW (ref 40.0–52.0)
HGB: 13.2 g/dL (ref 13.0–18.0)
LYMPHS PCT: 18.1 %
Lymphocyte #: 0.5 x10 3/mm — ABNORMAL LOW (ref 1.0–3.6)
MCH: 31.6 pg (ref 26.0–34.0)
MCHC: 33.1 g/dL (ref 32.0–36.0)
MCV: 95 fL (ref 80–100)
MONO ABS: 0.8 x10 3/mm (ref 0.2–1.0)
Monocyte %: 26.2 %
NEUTROS ABS: 1.7 x10 3/mm (ref 1.4–6.5)
Neutrophil %: 54.9 %
PLATELETS: 148 x10 3/mm — AB (ref 150–440)
RBC: 4.18 10*6/uL — ABNORMAL LOW (ref 4.40–5.90)
RDW: 18.4 % — AB (ref 11.5–14.5)
WBC: 3 x10 3/mm — AB (ref 3.8–10.6)

## 2014-08-04 LAB — CEA: CEA: 118.2 ng/mL — AB (ref 0.0–4.7)

## 2014-08-21 LAB — CBC CANCER CENTER
BASOS ABS: 0 x10 3/mm (ref 0.0–0.1)
Basophil %: 0.8 %
EOS PCT: 0.9 %
Eosinophil #: 0 x10 3/mm (ref 0.0–0.7)
HCT: 35.6 % — AB (ref 40.0–52.0)
HGB: 11.6 g/dL — ABNORMAL LOW (ref 13.0–18.0)
LYMPHS ABS: 0.4 x10 3/mm — AB (ref 1.0–3.6)
Lymphocyte %: 26 %
MCH: 31.1 pg (ref 26.0–34.0)
MCHC: 32.7 g/dL (ref 32.0–36.0)
MCV: 95 fL (ref 80–100)
MONO ABS: 0 x10 3/mm — AB (ref 0.2–1.0)
Monocyte %: 1.9 %
NEUTROS PCT: 70.4 %
Neutrophil #: 1.2 x10 3/mm — ABNORMAL LOW (ref 1.4–6.5)
PLATELETS: 60 x10 3/mm — AB (ref 150–440)
RBC: 3.74 10*6/uL — ABNORMAL LOW (ref 4.40–5.90)
RDW: 17.2 % — ABNORMAL HIGH (ref 11.5–14.5)
WBC: 1.6 x10 3/mm — CL (ref 3.8–10.6)

## 2014-08-28 LAB — CBC CANCER CENTER
Basophil #: 0 x10 3/mm (ref 0.0–0.1)
Basophil %: 0.4 %
Eosinophil #: 0 x10 3/mm (ref 0.0–0.7)
Eosinophil %: 4.3 %
HCT: 31.8 % — AB (ref 40.0–52.0)
HGB: 10.5 g/dL — ABNORMAL LOW (ref 13.0–18.0)
LYMPHS PCT: 53.4 %
Lymphocyte #: 0.3 x10 3/mm — ABNORMAL LOW (ref 1.0–3.6)
MCH: 31.3 pg (ref 26.0–34.0)
MCHC: 33.1 g/dL (ref 32.0–36.0)
MCV: 94 fL (ref 80–100)
Monocyte #: 0.1 x10 3/mm — ABNORMAL LOW (ref 0.2–1.0)
Monocyte %: 18.8 %
NEUTROS ABS: 0.1 x10 3/mm — AB (ref 1.4–6.5)
NEUTROS PCT: 23.1 %
Platelet: 67 x10 3/mm — ABNORMAL LOW (ref 150–440)
RBC: 3.37 10*6/uL — AB (ref 4.40–5.90)
RDW: 17.1 % — ABNORMAL HIGH (ref 11.5–14.5)
WBC: 0.6 x10 3/mm — AB (ref 3.8–10.6)

## 2014-08-29 ENCOUNTER — Ambulatory Visit: Payer: Self-pay | Admitting: Oncology

## 2014-08-29 LAB — CEA: CEA: 237.9 ng/mL — AB (ref 0.0–4.7)

## 2014-09-01 LAB — CBC CANCER CENTER
BASOS PCT: 0.5 %
Basophil #: 0 x10 3/mm (ref 0.0–0.1)
EOS ABS: 0 x10 3/mm (ref 0.0–0.7)
Eosinophil %: 1 %
HCT: 30.9 % — ABNORMAL LOW (ref 40.0–52.0)
HGB: 10.3 g/dL — ABNORMAL LOW (ref 13.0–18.0)
LYMPHS ABS: 1.1 x10 3/mm (ref 1.0–3.6)
LYMPHS PCT: 29.6 %
MCH: 31.5 pg (ref 26.0–34.0)
MCHC: 33.2 g/dL (ref 32.0–36.0)
MCV: 95 fL (ref 80–100)
MONO ABS: 0.7 x10 3/mm (ref 0.2–1.0)
MONOS PCT: 20 %
Neutrophil #: 1.8 x10 3/mm (ref 1.4–6.5)
Neutrophil %: 48.9 %
PLATELETS: 92 x10 3/mm — AB (ref 150–440)
RBC: 3.25 10*6/uL — ABNORMAL LOW (ref 4.40–5.90)
RDW: 17.7 % — AB (ref 11.5–14.5)
WBC: 3.6 x10 3/mm — ABNORMAL LOW (ref 3.8–10.6)

## 2014-09-01 LAB — COMPREHENSIVE METABOLIC PANEL
ALBUMIN: 2.5 g/dL — AB (ref 3.4–5.0)
ALK PHOS: 135 U/L — AB
ALT: 53 U/L
Anion Gap: 14 (ref 7–16)
BILIRUBIN TOTAL: 0.5 mg/dL (ref 0.2–1.0)
BUN: 14 mg/dL (ref 7–18)
CHLORIDE: 97 mmol/L — AB (ref 98–107)
CO2: 24 mmol/L (ref 21–32)
Calcium, Total: 9.1 mg/dL (ref 8.5–10.1)
Creatinine: 1.42 mg/dL — ABNORMAL HIGH (ref 0.60–1.30)
EGFR (African American): >60
GFR CALC NON AF AMER: 51 — AB
Glucose: 160 mg/dL — ABNORMAL HIGH (ref 65–99)
OSMOLALITY: 274 (ref 275–301)
Potassium: 4.2 mmol/L (ref 3.5–5.1)
SGOT(AST): 44 U/L — ABNORMAL HIGH (ref 15–37)
Sodium: 135 mmol/L — ABNORMAL LOW (ref 136–145)
TOTAL PROTEIN: 7.1 g/dL (ref 6.4–8.2)

## 2014-09-06 LAB — COMPREHENSIVE METABOLIC PANEL
ALK PHOS: 189 U/L — AB
ANION GAP: 11 (ref 7–16)
Albumin: 2.7 g/dL — ABNORMAL LOW (ref 3.4–5.0)
BUN: 29 mg/dL — AB (ref 7–18)
Bilirubin,Total: 0.6 mg/dL (ref 0.2–1.0)
CALCIUM: 9.6 mg/dL (ref 8.5–10.1)
Chloride: 98 mmol/L (ref 98–107)
Co2: 26 mmol/L (ref 21–32)
Creatinine: 1.35 mg/dL — ABNORMAL HIGH (ref 0.60–1.30)
EGFR (African American): >60
GFR CALC NON AF AMER: 54 — AB
Glucose: 172 mg/dL — ABNORMAL HIGH (ref 65–99)
OSMOLALITY: 280 (ref 275–301)
Potassium: 4.4 mmol/L (ref 3.5–5.1)
SGOT(AST): 67 U/L — ABNORMAL HIGH (ref 15–37)
SGPT (ALT): 101 U/L — ABNORMAL HIGH
Sodium: 135 mmol/L — ABNORMAL LOW (ref 136–145)
TOTAL PROTEIN: 7.4 g/dL (ref 6.4–8.2)

## 2014-09-06 LAB — CBC CANCER CENTER
Basophil #: 0 x10 3/mm (ref 0.0–0.1)
Basophil %: 0.3 %
Eosinophil #: 0 x10 3/mm (ref 0.0–0.7)
Eosinophil %: 0.3 %
HCT: 31.9 % — ABNORMAL LOW (ref 40.0–52.0)
HGB: 10.6 g/dL — ABNORMAL LOW (ref 13.0–18.0)
LYMPHS ABS: 2.4 x10 3/mm (ref 1.0–3.6)
LYMPHS PCT: 17.8 %
MCH: 31.4 pg (ref 26.0–34.0)
MCHC: 33.3 g/dL (ref 32.0–36.0)
MCV: 94 fL (ref 80–100)
MONO ABS: 1.3 x10 3/mm — AB (ref 0.2–1.0)
Monocyte %: 9.6 %
Neutrophil #: 9.9 x10 3/mm — ABNORMAL HIGH (ref 1.4–6.5)
Neutrophil %: 72 %
Platelet: 151 x10 3/mm (ref 150–440)
RBC: 3.39 10*6/uL — AB (ref 4.40–5.90)
RDW: 18.2 % — ABNORMAL HIGH (ref 11.5–14.5)
WBC: 13.7 x10 3/mm — ABNORMAL HIGH (ref 3.8–10.6)

## 2014-09-19 LAB — CBC CANCER CENTER
BASOS ABS: 0 x10 3/mm (ref 0.0–0.1)
Basophil %: 0.2 %
Eosinophil #: 0.1 x10 3/mm (ref 0.0–0.7)
Eosinophil %: 1.1 %
HCT: 32.8 % — ABNORMAL LOW (ref 40.0–52.0)
HGB: 10.8 g/dL — ABNORMAL LOW (ref 13.0–18.0)
Lymphocyte #: 1.1 x10 3/mm (ref 1.0–3.6)
Lymphocyte %: 9 %
MCH: 30.8 pg (ref 26.0–34.0)
MCHC: 33 g/dL (ref 32.0–36.0)
MCV: 94 fL (ref 80–100)
Monocyte #: 0.9 x10 3/mm (ref 0.2–1.0)
Monocyte %: 7.6 %
NEUTROS PCT: 82.1 %
Neutrophil #: 9.8 x10 3/mm — ABNORMAL HIGH (ref 1.4–6.5)
Platelet: 113 x10 3/mm — ABNORMAL LOW (ref 150–440)
RBC: 3.5 10*6/uL — ABNORMAL LOW (ref 4.40–5.90)
RDW: 18.9 % — ABNORMAL HIGH (ref 11.5–14.5)
WBC: 12 x10 3/mm — AB (ref 3.8–10.6)

## 2014-09-19 LAB — COMPREHENSIVE METABOLIC PANEL
ALK PHOS: 174 U/L — AB
ALT: 34 U/L
AST: 32 U/L (ref 15–37)
Albumin: 2.4 g/dL — ABNORMAL LOW (ref 3.4–5.0)
Anion Gap: 9 (ref 7–16)
BILIRUBIN TOTAL: 0.5 mg/dL (ref 0.2–1.0)
BUN: 19 mg/dL — ABNORMAL HIGH (ref 7–18)
CALCIUM: 8.7 mg/dL (ref 8.5–10.1)
CREATININE: 1.24 mg/dL (ref 0.60–1.30)
Chloride: 96 mmol/L — ABNORMAL LOW (ref 98–107)
Co2: 29 mmol/L (ref 21–32)
EGFR (Non-African Amer.): 59 — ABNORMAL LOW
Glucose: 120 mg/dL — ABNORMAL HIGH (ref 65–99)
Osmolality: 272 (ref 275–301)
Potassium: 4.5 mmol/L (ref 3.5–5.1)
SODIUM: 134 mmol/L — AB (ref 136–145)
Total Protein: 7 g/dL (ref 6.4–8.2)

## 2014-09-19 LAB — MAGNESIUM: Magnesium: 2.1 mg/dL

## 2014-09-20 LAB — CEA: CEA: 154.5 ng/mL — ABNORMAL HIGH (ref 0.0–4.7)

## 2014-09-21 DIAGNOSIS — F419 Anxiety disorder, unspecified: Secondary | ICD-10-CM | POA: Diagnosis not present

## 2014-09-21 DIAGNOSIS — N401 Enlarged prostate with lower urinary tract symptoms: Secondary | ICD-10-CM

## 2014-09-21 DIAGNOSIS — R1312 Dysphagia, oropharyngeal phase: Secondary | ICD-10-CM | POA: Diagnosis not present

## 2014-09-21 DIAGNOSIS — C189 Malignant neoplasm of colon, unspecified: Secondary | ICD-10-CM | POA: Diagnosis not present

## 2014-09-26 DIAGNOSIS — R0600 Dyspnea, unspecified: Secondary | ICD-10-CM

## 2014-09-27 ENCOUNTER — Telehealth: Payer: Self-pay | Admitting: Family Medicine

## 2014-09-27 ENCOUNTER — Other Ambulatory Visit: Payer: Self-pay | Admitting: Internal Medicine

## 2014-09-27 ENCOUNTER — Ambulatory Visit: Payer: PRIVATE HEALTH INSURANCE | Admitting: Internal Medicine

## 2014-09-27 MED ORDER — LIDOCAINE 5 % EX PTCH: 1.0000 | MEDICATED_PATCH | CUTANEOUS | Status: DC

## 2014-09-27 NOTE — Telephone Encounter (Signed)
Edward Bradley with Hospice left vm on triage phone stating that pt has been having severe pain in his back and morphine has not been effective.  She is requesting RX for Lidoderm patch.  Cb [id number].  Dr. Silvio Pate out of office, routed to Webb Silversmith, NP.

## 2014-09-27 NOTE — Telephone Encounter (Signed)
Lidoderm sent to [facility]

## 2014-09-28 ENCOUNTER — Other Ambulatory Visit: Payer: Self-pay | Admitting: Internal Medicine

## 2014-09-28 MED ORDER — LIDOCAINE 5 % EX PTCH
1.0000 | MEDICATED_PATCH | CUTANEOUS | Status: AC
Start: 2014-09-28 — End: ?

## 2014-09-29 ENCOUNTER — Ambulatory Visit: Payer: Self-pay | Admitting: Oncology

## 2014-09-29 NOTE — Telephone Encounter (Signed)
He is at [facility] should have gone via [employer]

## 2014-09-30 ENCOUNTER — Telehealth: Payer: Self-pay | Admitting: Family Medicine

## 2014-10-02 NOTE — Telephone Encounter (Signed)
His was expected

## 2014-10-02 NOTE — Telephone Encounter (Signed)
PLEASE NOTE: All timestamps contained within this report are represented as [place] Standard Time. CONFIDENTIALTY NOTICE: This fax transmission is intended only for the addressee. It contains information that is legally privileged, confidential or otherwise protected from use or disclosure. If you are not the intended recipient, you are strictly prohibited from reviewing, disclosing, copying using or disseminating any of this information or taking any action in reliance on or regarding this information. If you have received this fax in error, please notify us immediately by telephone so that we can arrange for its return to [place]. Phone: [phone], Toll-Free: [phone], Fax: [phone] Page: 1 of 1 Call Id: [id number] [place] Patient Name: Edward Bradley Gender: Male DOB: [birth year] Age: 81 Y 1 M 25 D Return Phone Number: Address: [address] [place] Knows City/State/Zip: [place] [address] Client [place] Night - Client Client Site [place] Physician Viviana Simpler Contact Type Call Call Type Page Only Caller Name Jasper Loser Relationship To Patient Provider Is this call to report lab results? No Return Phone Number Unavailable Initial Comment Caller Jasper Loser, nurse w/[facility], [phone], has pt who has passed, needs orders to release body to funeral home Nurse Assessment Guidelines Guideline Title Affirmed Question Affirmed Notes Nurse Date/Time ([place] Time) Disp. Time Eilene Ghazi Time) Disposition Final User 2014-10-20 10:34:06 PM Send to [place], [place] Oct 20, 2014 10:41:08 PM Paged On Call back to Call [place], [place] 10/20/14 10:48:25 PM Page Completed Yes Eusebio Friendly After Care Instructions Given Call Event Type User Date / Time Description Paging DoctorName DoctorPhone  DateTime Result/Outcome Notes Crissie Sickles [phone] 10-20-14 10:41:08 PM Paged On Call Back to Call [place] October 20, 2014 10:48:16 PM Spoke with On Call - General Provided on-call with page information . Connected the oncall to the caller.

## 2014-10-30 NOTE — Telephone Encounter (Signed)
Received a call from nurse at [facility] stating patient had passed away.  She was requesting orders to release pt to funeral home.  FYI.

## 2014-10-30 DEATH — deceased

## 2015-01-16 NOTE — Op Note (Signed)
PATIENT NAME:  Edward Bradley, Edward Bradley MR#:  [id number] DATE OF BIRTH:  01-15-1931  DATE OF PROCEDURE:  04/16/2012  PREOPERATIVE DIAGNOSIS: Paracolostomy hernia, rectal cancer.   POSTOPERATIVE DIAGNOSIS: Paracolostomy hernia, rectal cancer.  PROCEDURE: Paracolostomy hernia repair.   SURGEON: Rochel Brome, MD   ANESTHESIA: General.   INDICATIONS: This 81 year old male has a history of obstructing carcinoma of the rectum with distant metastasis. He had a loop transverse colostomy created in the left upper quadrant and recently has been having marked bulging with associated pain in that the colostomy would prolapse of approximately 4 to 5 inches into the bag. This was reducible and repair was recommended to alleviate his pain.   DESCRIPTION OF PROCEDURE: The patient was placed on the operating table in the supine position under general anesthesia. The colostomy bag was removed from the left upper quadrant. Alcohol and cotton gauze was used to remove some the adhesive off the skin. Next, the abdomen was prepared with ChloraPrep, and also the skin immediately around the colostomy was prepared with Betadine paint. Subsequently, the Betadine paint was removed with alcohol on the medial side, and then this site was prepared with benzoin and allowed to dry. An [place] drape was a 1-inch border was applied to isolate the colostomy from the midline of the abdomen. Next, the site was draped with sterile towels and sheets.   An upper abdominal midline incision was made and carried down through subcutaneous tissues. Numerous small bleeding points were cauterized with electrocautery. The midline fascia was incised and the peritoneal cavity was opened. There was a hernia defect at the colostomy, and it could be demonstrated how it could prolapse. Next, some adhesions were taken down, portions of omentum which were attached to the abdominal wall around the colostomy site.  The transverse colon efferent and afferent loops  were identified and dissected free from surrounding structures. Next, there was peritoneum lining a portion of the abdominal wall defect which was dissected away from the fascia. Next, the fascia which was inferolateral was repaired with interrupted 0 Prolene simple sutures, and also a portion of fascia which was superior and medial was repaired as well, making the fascia snug around the bowel. Next, Gore-Tex Bio-A mesh was selected using a 7 x 10 cm patch which was cut to a rounded shape of some 7 cm in diameter with a U-shaped incision made centrally to straddle the loops of bowel. This was inserted and passed around the bowel. It was sewn to the fascia with 0 Maxon simple sutures with just a small opening medially near the afferent loop which remained open. As noted, also during the course of the procedure one bleeding point was suture ligated with 0 chromic; and this was in the properitoneal fat, and his estimated blood loss was approximately 40 mL for the procedure. Hemostasis subsequently appeared to be intact. The midline fascia was closed with interrupted 0 Maxon figure-of-eight sutures, and the skin was approximated with skin clips. Dressings were applied. Also, the skin surrounding the stoma was cleaned and treated with benzoin and an ostomy bag was applied. The gauze was secured with paper tape. The patient tolerated surgery satisfactorily and was then prepared for transfer to the recovery room.  ____________________________ Lenna Sciara. Rochel Brome, MD [email or web address] D: 04/16/2012 12:48:49 ET T: 04/16/2012 13:06:28 ET JOB#: [id number]  cc: Loreli Dollar, MD, <Dictator> Loreli Dollar MD ELECTRONICALLY SIGNED 04/17/2012 13:59

## 2015-01-16 NOTE — Discharge Summary (Signed)
PATIENT NAME:  Edward Bradley, Edward Bradley MR#:  [id number] DATE OF BIRTH:  12-02-1930  DATE OF ADMISSION:  04/16/2012 DATE OF DISCHARGE:  04/20/2012  HISTORY OF PRESENT ILLNESS: This 81 year old male has a history of cancer of the rectum. He had had chemotherapy and radiation and did not have proctectomy due to distant metastasis but did have proximal colostomy which was initially done in the left lower quadrant at another facility and later was closed, but had to have another colostomy of the transverse colon, in the left upper quadrant. He came in for office consultation due to a chief complaint of large hernia with marked prolapsing of the hernia.   Past medical history includes the above-mentioned cancer of the rectum. He has had a PET scan which demonstrated activity in the rectum and also in the liver. Other history includes asthma, eczema, and benign prostatic hypertrophy.   Other details of the past medical history are recorded on the typed history and physical.   HOSPITAL COURSE: He came in prepared for surgery. He had a preop prophylactic antibiotic and was carried to the operating room where he had an upper abdominal midline incision and properitoneal repair of a large paracolostomy hernia, which was in the left upper quadrant. We did use Gore-Tex Bio-A absorbable mesh to reinforce the repair.   Postoperatively he was treated with subcutaneous heparin and analgesics. He soon began on a liquid diet and gradually advanced and did demonstrate colostomy function.  DISCHARGE DIAGNOSES:  1. Cancer of the rectum. 2. Paracolostomy hernia.   OPERATION: Paracolostomy hernia repair.        DISCHARGE INSTRUCTIONS: Wound care instructions were given and plans were made for follow-up in the office. ____________________________ J. Rochel Brome, MD [email or web address]:[id number] D: 05/03/2012 19:55:38 ET T: 05/04/2012 10:00:40 ET JOB#: [id number]  cc: Loreli Dollar, MD, <Dictator> Loreli Dollar MD ELECTRONICALLY SIGNED  05/05/2012 8:53

## 2015-01-19 NOTE — Op Note (Signed)
PATIENT NAME:  Edward Bradley, Edward Bradley MR#:  [id number] DATE OF BIRTH:  1930/10/07  DATE OF PROCEDURE:  03/28/2013  PREOPERATIVE DIAGNOSIS: Cataract , left eye.   POSTOPERATIVE DIAGNOSIS:  Cataract, left eye.   PROCEDURE PERFORMED: Extracapsular cataract extraction using phacoemulsification with placement Alcon [id number], 23.0 diopter posterior chamber lens, serial number [id number].   SURGEON:  Loura Back. Montel Vanderhoof, MD  ANESTHESIOLOGIST: Dr. Kayleen Memos.   COMPLICATIONS: None.   ESTIMATED BLOOD LOSS: Less than 1 mL.   DESCRIPTION OF PROCEDURE:  The patient was brought to the operating room and given a peribulbar block.  The patient was then prepped and draped in the usual fashion.  The vertical rectus muscles were imbricated using 5-0 silk sutures.  These sutures were then clamped to the sterile drapes as bridle sutures.  A limbal peritomy was performed extending two clock hours and hemostasis was obtained with cautery.  A partial thickness scleral groove was made at the surgical limbus and dissected anteriorly in a lamellar dissection using an Alcon crescent knife.  The anterior chamber was entered supero-temporally with a Superblade and through the lamellar dissection with a 2.6 mm keratome.  DisCoVisc was used to replace the aqueous and a continuous tear capsulorrhexis was carried out.  Hydrodissection and hydrodelineation were carried out with balanced salt and a 27 gauge canula.  The nucleus was rotated to confirm the effectiveness of the hydrodissection.  Phacoemulsification was carried out using a divide-and-conquer technique.  Total ultrasound time was 1 minute, 15 seconds with an average power of 23.4 percent, CDE of 32.94.  Irrigation/aspiration was used to remove the residual cortex.  DisCoVisc was used to inflate the capsule and the internal incision was enlarged to 3 mm with the crescent knife.  The intraocular lens was folded and inserted into the capsular bag using the AcrySert delivery  system.  Irrigation/aspiration was used to remove the residual DisCoVisc.  Miostat was injected into the anterior chamber through the paracentesis track to inflate the anterior chamber and induce miosis.  One-then of a milliliter of cefuroxime was injected through the paracentesis track. The wound was checked for leaks and none were found. The conjunctiva was closed with cautery and the bridle sutures were removed.  Two drops of 0.3% Vigamox were placed on the eye.   An eye shield was placed on the eye.  The patient was discharged to the recovery room in good condition.     ____________________________ Loura Back Jameah Rouser, MD sad:cc D: 03/28/2013 13:59:11 ET T: 03/28/2013 14:12:09 ET JOB#: [id number]  cc: Remo Lipps A. Jailine Lieder, MD, <Dictator> Martie Lee MD ELECTRONICALLY SIGNED 04/04/2013 13:24

## 2015-01-19 NOTE — Op Note (Signed)
PATIENT NAME:  Edward Bradley, Edward Bradley MR#:  [id number] DATE OF BIRTH:  Nov 22, 1930  DATE OF PROCEDURE:  01/03/2013  PREOPERATIVE DIAGNOSIS: Cataract, right eye.   POSTOPERATIVE DIAGNOSIS: Cataract, right eye.   PROCEDURE PERFORMED: Extracapsular cataract extraction using phacoemulsification with placement of Alcon SN6CWS 22.5-diopter posterior chamber lens, serial number #[id number].   SURGEON: Loura Back. Annelisa Ryback, M.D.   ANESTHESIA: 4% lidocaine, 0.75% Marcaine in a 50:50 mixture with 10 units/mL of Hylenex  added, given as a peribulbar.   ANESTHESIOLOGIST: Dr. Andree Elk.   COMPLICATIONS: None.   ESTIMATED BLOOD LOSS: Less than 1 mL.   DESCRIPTION OF PROCEDURE:  The patient was brought to the operating room and given a peribulbar block.  The patient was then prepped and draped in the usual fashion.  The vertical rectus muscles were imbricated using 5-0 silk sutures.  These sutures were then clamped to the sterile drapes as bridle sutures.  A limbal peritomy was performed extending two clock hours and hemostasis was obtained with cautery.  A partial thickness scleral groove was made at the surgical limbus and dissected anteriorly in a lamellar dissection using an Alcon crescent knife.  The anterior chamber was entered superonasally with a Superblade and through the lamellar dissection with a 2.6 mm keratome.  DisCoVisc was used to replace the aqueous and a continuous tear capsulorrhexis was carried out.  Hydrodissection and hydrodelineation were carried out with balanced salt and a 27 gauge canula.  The nucleus was rotated to confirm the effectiveness of the hydrodissection.  Phacoemulsification was carried out using a divide-and-conquer technique.  Total ultrasound time was 1 minute and 31.2 seconds, with an average power of 24.2%. CDE 40.56.  Irrigation/aspiration was used to remove the residual cortex.  DisCoVisc was used to inflate the capsule and the internal incision was enlarged to 3 mm with  the crescent knife.  The intraocular lens was folded and inserted into the capsular bag using AcrySert delivery system.  Irrigation/aspiration was used to remove the residual DisCoVisc.  Miostat was injected into the anterior chamber through the paracentesis track to inflate the anterior chamber and induce miosis.  The wound was checked for leaks and none were found. The conjunctiva was closed with cautery and the bridle sutures were removed.  Two drops of 0.3% Vigamox were placed on the eye.   An eye shield was placed on the eye.   No suture was placed.   The patient was discharged to the recovery room in good condition.    ____________________________ Loura Back Aithan Farrelly, MD sad:dm D: 01/03/2013 14:29:00 ET T: 01/03/2013 15:30:46 ET JOB#: [id number]  cc: Remo Lipps A. Purvis Sidle, MD, <Dictator> Martie Lee MD ELECTRONICALLY SIGNED 01/10/2013 13:29

## 2015-01-20 NOTE — Consult Note (Signed)
Reason for Visit: This 81 year old Male patient presents to the clinic for initial evaluation of  Bone Mets  Spine.   Referred by Dr. Oliva Bustard.  Diagnosis:  HPI   He has a history of rectal cancer, the initial state in unclear but was diagnosed by colonoscopy.  I believe he was metastatic at that time in 2010.  He underwent neoadjuvant chemoRT completing 33 radiation treatments in November of 2010. Review of his treatment records and fields show that he was treated to a dose of 50.4 Gy with 45 Gy extending to L5/S1. He has been treated with multiple chemetherapeutic agents most recently [place] which caused hand foot sydnrome.  He had spent many months working on his feet and bending over which he atrributed the back pain. He presented to Dr. Oliva Bustard earlier this week complaining of low back pain particularly worse in the morning. Dr. Oliva Bustard placed him on Duragesic which he has titrated up to 37.5 mcg with relief of his pain from a 10 to a 6. He is tolerating the narcotics ok although he does feel loopy after increasing the dose. He would like to start treatment as soon as possible.  It is very painful for him to lie flat. He does not have any prn or breakthrough medications.  He has no difficulty walking. No new issues with bowel or bladder and no mid back pain. He has no radiating pain down his legs.  He feels strong and was grocery shopping earlier this week. An MRI of the thoracic spine was performed which showed a metastatic lesion involving the paraspinous musculature at L1-L3 measuring 3.1 x 3.1 cm.  Nodular enhancement at T8-T11 was noted concerning for metastses but also possibly blood vessels. A completion MRI of the spine is scheduled for next thursday.   Past Hx:    Cancer:    BPH - Benign Prostatic Hypertrophy:    Asthma:    Hyperlipidemia:    rectal cancer:    High cholesterol:    Colostomy:    Spinal surgery:   Past, Family and Social History:  Family History noncontributory    Social History noncontributory   Home Environment lives alone   Allergies:   Morphine: Hallucinations  Sulfa drugs: Hives  Seafood: N/V/Diarrhea  Home Meds:  Home Medications: Medication Instructions Status  Ativan 1 mg oral tablet 1 tab(s) orally once 1 hour prior to MRI scan Active  Duragesic-25 25 mcg/hr transdermal film, extended release 1 patch transdermal every 72 hours  *to be used with 48mg patch for total of 379m* Active  Duragesic-12 12 mcg/hr transdermal film, extended release 2 patch transdermal every 72 hours Active  Eucerin topical cream plus aristocort  1 application transdermal  3 times a day Active  Eucerin topical lotion Apply topically to affected area 2 times a day Active  Percocet 10/325 oral tablet 1 tab(s) orally every 6 hours, As Needed Active  Zofran 4 mg oral tablet 1 tab(s) orally every 6 hours, As Needed Active  simvastatin 40 mg oral tablet 1 tab(s) orally once a day (at bedtime) Active  Flomax 0.4 mg oral capsule 1 cap(s) orally once a day Active  Vitamin D3 1000 intl units oral tablet 1 tab(s) orally once a day Active  Unisom 1 cap(s) orally once a day (at bedtime) Active  multivitamin with minerals 1 tab(s) orally once a day Active  Co Q-10 100 mg oral capsule 1 cap(s) orally once a day Active  doxycycline hyclate hyclate 100 mg oral  capsule 1 cap(s) orally 2 times a day Active  lonsurf 32m tablets 3 tab(s) orally 2 times a day Active  lonsurf 220mtablets 3 tab(s) orally 2 times a day Active   Review of Systems:  General negative   Performance Status (ECOG) 1   Skin Symptoms rash; on feet due to chemo. resolved.   Gastrointestinal Comments colostomy anterior abdominal wall. Is aware of narcotic induced constipation and currently is not having any.   Genitourinary negative   Musculoskeletal see HPI   Psychiatric negative   Nursing Notes:  Nursing Vital Signs and Chemo Nursing Nursing Notes: *CC Vital Signs Flowsheet:   08-Oct-15  10:52  Temp Temperature 97.1  Pulse Pulse 82  Respirations Respirations 20  SBP SBP 152  DBP DBP 82  Current Weight (kg) (kg) 69  Height (cm) centimeters 167.6  BSA (m2) 1.7   Physical Exam:  General/Skin/HEENT:  General normal   Skin normal   Head and Neck normal   Breasts/Resp/CV/GI/GU:  Respiratory and Thorax normal   Cardiovascular normal   Gastrointestinal Details soft; colostomy on anterior abdominal wall   MS/Neuro/Psych/Lymph:  Musculoskeletal Details ROM intact; no joint swelling; normal strength   Back Details palpable and visible mass tender to palpation over upper lumber spine.   Neurological normal   Psychiatric normal   LAB Results:  Laboratory Results: Hepatic:  21-Sep-15 09:17   Bilirubin, Total 0.5  Alkaline Phosphatase 82 (46-116 NOTE: New Reference Range 04/18/14)  SGPT (ALT) 33 (14-63 NOTE: New Reference Range 04/18/14)  SGOT (AST) 31  Total Protein, Serum 7.1  Albumin, Serum 3.6  Routine Chem:  21-Sep-15 09:17   Glucose, Serum  146  BUN 11  Creatinine (comp) 1.19  Sodium, Serum 145  Potassium, Serum 4.8  Chloride, Serum 107  CO2, Serum 30  Calcium (Total), Serum 9.6  Osmolality (calc) 291  eGFR (African American) >60  eGFR (Non-African American)  57 (eGFR values <6060min/1.73 m2 may be an indication of chronic kidney disease (CKD). Calculated eGFR is useful in patients with stable renal function. The eGFR calculation will not be reliable in acutely ill patients when serum creatinine is changing rapidly. It is not useful in  patients on dialysis. The eGFR calculation may not be applicable to patients at the low and high extremes of body sizes, pregnant women, and vegetarians.)  Anion Gap 8  Magnesium, Serum  1.7 (1.8-2.4 THERAPEUTIC RANGE: 4-7 mg/dL TOXIC: > 10 mg/dL  -----------------------)  Routine Hem:  21-Sep-15 09:17   WBC (CBC) 6.4  RBC (CBC) 4.94  Hemoglobin (CBC) 15.3  Hematocrit (CBC) 46.6  Platelet Count  (CBC) 158  MCV 94  MCH 31.0  MCHC 32.8  RDW  17.0  Neutrophil % 60.7  Lymphocyte % 27.0  Monocyte % 9.6  Eosinophil % 2.3  Basophil % 0.4  Neutrophil # 3.9  Lymphocyte # 1.7  Monocyte # 0.6  Eosinophil # 0.1  Basophil # 0.0 (Result(s) reported on 19 Jun 2014 at 09:31AM.)   Other Results:  Radiology Results: MRI:    06-Oct-15 09:42, [facility]   REASON FOR EXAM:    spinal cord compression protocol  Back pain  COMMENTS:       PROCEDURE: MR  - MR THORACIC SPINE WO/W  - Jul 04 2014  9:42AM     CLINICAL DATA:  History of metastatic rectal carcinoma. Mid back  pain. The patient reports a palpablelesion the mid thoracic spine.    EXAM:  MRI THORACIC SPINE WITHOUT AND WITH CONTRAST    TECHNIQUE:  Multiplanar and multiecho pulse sequences of the thoracic spine were  obtained without and with intravenous contrast.  CONTRAST:  17 cc MultiHance IV.    COMPARISON:  PET CT scan 05/15/2014.    FINDINGS:  There is partial visualization of an enhancing lesion in the  paraspinous musculature posteriorly at the L1-2 and L2-3 levels. The  lesion likely involves the spinous process. It measures 3.1 cm  transverse by 3.1 cm AP and is consistent with a metastatic lesion.    Vertebral body height is maintained. A small focus of marrow signal  abnormality and postcontrast enhancement is seen the inferior  endplate of X50. Area of signal abnormality measures 1.1 cm AP by  0.8 cm craniocaudal. A second, smaller focus of abnormal signal is  seen in the superior endplate of T9. A few small hemangiomas are  also identified.    Small foci of nodular enhancement along the dorsal margin of the  cord at T8-9, mid T10 and at the T10-11 level are worrisome for  metastatic disease. Alternatively, these could represent vessels.    The central spinal canal and neural foramina are widely patent at  all levels. No disc bulge or protrusion is identified.  Bilateral  pulmonary metastases are identified and better seen on comparison  PET CT scan.     IMPRESSION:  Partial visualization of a mass lesion compatible with metastatic  disease in the posterior paraspinous musculature with likely  involvement of the L1 spinous process. If indicated, MRI lumbar  spine could be used for further evaluation.    Small foci of nodular enhancement along the surface of the mid and  lower thoracic cord are worrisome for metastatic disease. No cord  compression is present on the examination.    Small foci of marrow signal abnormality in the superior endplate of  T9 and inferior endplate at V69 are worrisome for metastatic  disease.      Electronically Signed    By: Inge Rise M.D.    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  Plan:   I spoke with Mr. Haggart today. He is asymptomatic from his thoracic spine and so I doubt these depsits are leptomeningeal disease. We discussed that if they are, leptomeningeal disease, this is difficult to treat and would likely be "the beginning of the end" as he puts it. We discussed treatment of this lumbar lesion which is clearly the source of his pain. We discussed that this is above his previously treated area and we will half beam block to prevent overlap and recreate the fields to ensure it is ok.  If anything is found on his completion MRI of the lumbar spine that will likely be in his previously radiated field and therefore untreatable which I explained to him. We discussed the process of simulation and the placment of tattoos. We discussed 10 fractions as an outpatient. We discussed nausea, vomitting and diarreha as well as fatigue as the common side effects of this treatment. We discussed that he should continue his pain medications and discuss a prn pain medication with Dr.  Oliva Bustard as well. We disscussed the goal of this treatment is palliative and his options were no treatment or further chemotherapy. He would like to proceed with treatment and I have scheduled  him for simulation later today. He signed informed consent.   Fax to Physician:  Physicians To Recieve Fax: Jobe Gibbon, MD - [phone].  Electronic Signatures: Thea Silversmith (MD)  (Signed 08-Oct-15 12:09)  Authored: HPI, Diagnosis, Past Hx, PFSH, Allergies, Home Meds, ROS, Nursing Notes, Physical Exam, LAB Results, Other Results, Encounter Assessment and Plan, Fax to Physician   Last Updated: 08-Oct-15 12:09 by Thea Silversmith (MD)

## 2015-01-20 NOTE — Discharge Summary (Signed)
Dates of Admission and Diagnosis:  Date of Admission 14-Jul-2014   Date of Discharge 15-Jul-2014   Admitting Diagnosis Shortness of breath   Final Diagnosis Shortness of breath- panic / anxiety attack Colon cancer with mets in lung Anxiety Chronic pain    Chief Complaint/History of Present Illness an 81 year old male who presents to the hospital complaining of shortness of breath progressively getting worse over the past couple of days. He denies any cough, any fevers, chills, any chest pain, any nausea, vomiting. He admits to some chronic constipation which is improved with some MiraLax and Colace that he is taking. Because his shortness of breath was not improving he came to the ER for further evaluation. In the Emergency Room the patient was noted to be hypoxic with room air O2 saturations to be in the 80s. The patient underwent a CT scan of his chest with contrast which showed no pulmonary embolism, but showed extensive pulmonary nodules and metastatic disease which is significantly worse from his previous study. Hospitalist services were contacted for treatment and evaluation.   Allergies:  Sulfa drugs: Hives  Seafood: N/V/Diarrhea  Pertinent Past History:  Pertinent Past History colon cancer with metastasis to liver and lung, chronic pain, constipation,   Hospital Course:  Hospital Course an 81 year old male with a history of colon cancer with metastases to liver and lung, chronic pain, constipation, benign prostatic hypertrophy, who presents to the hospital due to shortness of breath.   1.  Shortness of breath. likely panic attack.  likely secondary to the patient's extensive lung metastases and also propagated with underlying mild anxiety.   no evidence of pulmonary embolism or pneumonia. supportive care with O2 supplementation.   No need for home o2. Will give xanax prescription for home. 2.  Anxiety. As mentioned - as needed Xanax.  3.  Chronic pain. Continue his fentanyl  patch.  4.  Constipation. Continue with his MiraLax and Colace.  5.  Recurrent colon cancer with metastases to the lung and liver. well-known to Dr. Oliva Bustard. on Chemo and radiation. 6.  Benign prostatic hypertrophy. Continue Flomax. discharge home.   Condition on Discharge Stable   Code Status:  Code Status Full Code   DISCHARGE INSTRUCTIONS HOME MEDS:  Medication Reconciliation: Patient's Home Medications at Discharge:     Medication Instructions  tamsulosin 0.4 mg oral capsule  2 cap(s) orally once a day   lonsurf 20 mg tablets  3 tab(s) orally 2 times a day on days 1-5 and days 8-12 every 4 weeks   docusate sodium 100 mg oral capsule  1 cap(s) orally 2 times a day   ondansetron 4 mg oral tablet  1 tab(s) orally every 6 to 8 hours, As Needed - for Nausea, Vomiting   polyethylene glycol 3350 - oral powder for reconstitution  17 gram(s) orally 2 times a day   alprazolam 0.25 mg oral tablet  1 tab(s) orally every 8 hours x 10 days, As Needed, anxiety , As needed, anxiety   fentanyl 50 mcg/hr transdermal film, extended release  1 patch transdermal every 72 hours     Physician's Instructions:  Diet Regular   Activity Limitations As tolerated   Return to Work Not Applicable   Time frame for Follow Up Appointment 1-2 weeks  Cancer center.     Leia Alf Raj(Consultant): [facility], [address], [place], [place], [place], [place], [place]   Juluis Pitch M(Family Physician): [facility], [address], [place],  [place] [address], [phone]  Electronic Signatures: Vaughan Basta (MD)  (Signed 22-Oct-15 15:33)  Authored: ADMISSION DATE AND DIAGNOSIS, CHIEF COMPLAINT/HPI, Allergies, PERTINENT PAST HISTORY, HOSPITAL COURSE, DISCHARGE INSTRUCTIONS HOME MEDS, PATIENT INSTRUCTIONS, Follow Up Physician   Last Updated: 22-Oct-15 15:33 by Vaughan Basta (MD)

## 2015-01-20 NOTE — Consult Note (Signed)
PATIENT NAME:  Edward Bradley, Edward Bradley MR#:  [id number] DATE OF BIRTH:  06-03-1931  DATE OF CONSULTATION:  07/15/2014   REFERRING PHYSICIAN:   Abel Presto, MD  CONSULTING PHYSICIAN: Clark Clowdus R. Sparsh Callens  MD    REASON FOR CONSULTATION: Rectal cancer with metastases.   HISTORY OF PRESENT ILLNESS: The patient is an 80 year old gentleman with a known history of stage IV metastatic rectal cancer status post multiple lines of chemotherapy and more recently has been on 5-FU/leucovorin/Vectibix. The patient otherwise denies any other chronic medical illnesses. He has been admitted to the hospital yesterday with complaints of progression of chronic shortness of breath worsening over the past couple of days.  He denied any sputum, hemoptysis, chest pain. No fever or chills.  He has intermittent mild nausea. No vomiting. No diarrhea or bleeding symptoms. CT scan of the chest was negative for pulmonary embolism but showed progression of pulmonary nodules, metastatic disease.  Currently he states that he is feeling better today, back at his baseline symptoms. Denies pain.   PAST MEDICAL AND SURGICAL HISTORY:  As in history of present illness above.   FAMILY HISTORY: Noncontributory.   SOCIAL HISTORY:  Ex-smoker, quit many years ago. Occasional alcohol intake. Lives by himself.   HOME MEDICATIONS: Colace 100 mg b.i.d., fentanyl patch 50 mcg every 72 hours, Zofran 4 mg every.6 to 8 hours p.r.n., MiraLax 17 grams b.i.d., Flomax 0.4 mg 2 capsules daily, Lonsurf  20 mg 3 tablets b.i.d. on days 1  through 5, and on days 8 to 12 every 4 weeks.   ALLERGIES: INCLUDE SULFA AND SEAFOOD.   REVIEW OF SYSTEMS:  CONSTITUTIONAL: Generalized weakness and fatigue on exertion. Dyspnea on exertion. No fever or chills.  HEENT: Denies any headaches or dizziness at rest. No epistaxis. No ear or jaw pain. CARDIAC: No angina, palpitation, orthopnea, or paroxysmal nocturnal dyspnea.  LUNGS: As in history of present illness.   GASTROINTESTINAL: As in history of present illness.  GENITOURINARY: No dysuria or hematuria.  MUSCULOSKELETAL: No new bone pain.  NEUROLOGIC: No new focal weakness, seizures or loss of consciousness.  ENDOCRINE: No polyuria or polydipsia. Appetite is fair.   PHYSICAL EXAMINATION:  GENERAL: Patient is elderly, weak-looking, otherwise, sitting in recliner chair, alert and oriented and converses appropriately. No acute distress. Not on oxygen at this time. No icterus.  VITAL SIGNS:  Temperature 97.4, pulse 88, respirations 20,  blood pressure 115/71, 98% on room air.  HEENT: Normocephalic, atraumatic. Extraocular movements intact. Sclerae anicteric.  NECK: Negative for lymphadenopathy.  CARDIOVASCULAR:   S1, S2, regular rate and rhythm.  LUNGS: Bilateral diminished breath sounds overall. No crepitations or rhonchi noted.  ABDOMEN: Soft, nontender.  EXTREMITIES: Trace edema.  NEUROLOGIC: Cranial nerves intact, grossly nonfocal.   LABORATORY, DIAGNOSTIC, AND RADIOLOGICAL DATA:  CT scan of the chest reveals innumerable bilateral pulmonary nodules increasing in size compared to prior study.  Diffuse interstitial thickening and reticular nodular densities in the left lower lobe and into the left upper lobe may reflect lymphangitic spread of tumor.  Stable mediastinal and bilateral hilar adenopathy. Enlarging right adrenal metastasis. No evidence of pulmonary embolus.   Troponin I less than 0.02   LABORATORY DATA:  Yesterday showed INR 1.0, hemoglobin 13.5, platelets 281,000. WBC 7500, creatinine 1.02, calcium 9.3.   IMPRESSION AND RECOMMENDATIONS: An 81 year old gentleman with known history of metastatic stage IV rectal cancer with lung metastasis status post multiple lines of chemotherapy, more recently on palliative chemotherapy by Dr. Oliva Bustard.  The patient is currently admitted  yesterday with progressive shortness of breath, but with supportive treatment symptoms have improved back to baseline.   CT scan of the chest shows progression of lung metastasis and adrenal metastasis.  I have explained CT scan findings to the patient and that this is likely etiology for progression of his shortness of breath.  The patient states that since he is feeling better today, he will likely go home.  I have advised him to call the [facility] on Monday, to discuss with Dr. Oliva Bustard if he needs to be seen earlier, given that he has evidence of progression of metastatic disease on CT scan. The patient otherwise does not have major pain issues. He is agreeable to this plan.   Thank you for the referral.  Please feel free to contact me for additional questions.     ____________________________ Rhett Bannister Ma Hillock, MD srp:DT D: 07/15/2014 16:32:39 ET T: 07/15/2014 17:12:28 ET JOB#: [id number]  cc: Trevor Iha R. Ma Hillock, MD, <Dictator> Alveta Heimlich MD ELECTRONICALLY SIGNED 07/16/2014 11:36

## 2015-01-20 NOTE — H&P (Signed)
PATIENT NAME:  Edward Bradley, Edward Bradley MR#:  [id number] DATE OF BIRTH:  1930-10-31  DATE OF ADMISSION:  07/14/2014  PRIMARY CARE PHYSICIAN: Dr. Juluis Pitch.   ONCOLOGIST:   Dr. Oliva Bustard.   CHIEF COMPLAINT: Shortness of breath.   HISTORY OF PRESENT ILLNESS: This is an 81 year old male who presents to the hospital complaining of shortness of breath progressively getting worse over the past couple of days. He denies any cough, any fevers, chills, any chest pain, any nausea, vomiting. He admits to some chronic constipation which is improved with some MiraLax and Colace that he is taking. Because his shortness of breath was not improving he came to the ER for further evaluation. In the Emergency Room the patient was noted to be hypoxic with room air O2 saturations to be in the 80s. The patient underwent a CT scan of his chest with contrast which showed no pulmonary embolism, but showed extensive pulmonary nodules and metastatic disease which is significantly worse from his previous study. Hospitalist services were contacted for treatment and evaluation.   REVIEW OF SYSTEMS:  CONSTITUTIONAL:  No documented fever. No weight gain or weight loss.  EYES: No blurred or double vision.  EARS, NOSE, THROAT: No tinnitus. No postnasal drip. No redness of the pharynx.  RESPIRATORY: Positive cough. No wheeze. No hemoptysis. Positive dyspnea.  CARDIOVASCULAR: No chest pain. No orthopnea. No palpitations or syncope.  GASTROINTESTINAL: No nausea, no vomiting. No diarrhea. Positive chronic constipation. No melena or hematochezia.  GENITOURINARY: No dysuria or hematuria.  ENDOCRINE: No polyuria or nocturia. No heat or cold intolerance.  HEMATOLOGIC: No anemia, no bruising, no bleeding.  INTEGUMENTARY: No rashes. No lesions.  MUSCULOSKELETAL: No arthritis. No swelling. No gout.  NEUROLOGIC: No numbness or tingling. No ataxia. No seizure activity.  PSYCHIATRIC: Positive anxiety. No insomnia. No ADD.   PAST MEDICAL  HISTORY: Consistent with history of colon cancer with metastasis to liver and lung, chronic pain, constipation,   ALLERGIES: SULFA DRUGS AND SEAFOOD.   SOCIAL HISTORY: Quit smoking many years ago. Occasional alcohol use. No illicit drug abuse. Lives by himself.   FAMILY HISTORY: Mother and father both deceased. Mother died from complications of diabetes. Father had asthma.   CURRENT MEDICATIONS: Colace 100 mg b.i.d., fentanyl patch 50 mcg q. 72 hours, Lonsurf  20 mg tablets 3 tabs b.i.d. on day 1-5 and days 8-12 every 4 weeks, Zofran 4 mg 1 tablet every 6-8 hours as needed, MiraLax 17 grams b.i.d., and Flomax 0.4 mg 2 caps daily.   PHYSICAL EXAMINATION:  VITAL SIGNS: Temperature is 97.9, pulse 76, respirations 14, blood pressure 148/64, saturation is 100% on 4 liters nasal cannula.   GENERAL: He is a pleasant but anxious-appearing male, but in no apparent distress.  HEAD, EYES, EARS, NOSE, AND THROAT: Atraumatic, normocephalic. Extraocular muscles are intact. Pupils equal and reactive to light. Sclerae anicteric. No conjunctival injection. No pharyngeal erythema.  NECK: Supple. There is no jugular venous distention. No bruits, no lymphadenopathy, no thyromegaly.  HEART: Regular rate and rhythm. No murmurs, no rubs, no clicks.  LUNGS: Clear to auscultation bilaterally. No rales or rhonchi. No wheezes.  ABDOMEN: Soft, flat, nontender, nondistended. Has good bowel sounds. No hepatosplenomegaly appreciated.  EXTREMITIES: No evidence of any cyanosis, clubbing, or peripheral edema. He has + 2 pedal and radial pulses bilaterally.  NEUROLOGICAL: The patient is alert, awake, and oriented x 3 with no focal motor or sensory deficits appreciated bilaterally.  SKIN: Moist and warm with no rashes appreciated.  LYMPHATIC: There  is no cervical or axillary lymphadenopathy.   LABORATORY DATA: Serum glucose of 105, BUN 9, creatinine 1.02, sodium 137, potassium 3.8, chloride 101, bicarbonate 28. LFTs are within  normal limits. White cell count 7.5, hemoglobin 13.5, hematocrit 42.3, platelet count 281,000. Troponin 0.02. INR 1.0. Urinalysis within normal limits.   The patient did have a chest x-ray done which showed right lower lobe nodules, left lower lobe airspace disease. Abdomen 3-way also showing no acute intra-abdominal pathology. A CT scan of the chest done with contrast showing innumerable bilateral pulmonary nodules and masses increasing in size since prior study compatible with worsening metastatic disease, diffuse interstitial thickening and reticular nodular densities in the left lower lobe, inferior left lower lobe may reflect lymphangitic spread of the tumor, stable mediastinal and bilateral hilar lymphadenopathy, enlarging right adrenal metastases, no evidence of pulmonary embolism.   ASSESSMENT AND PLAN: This is an 81 year old male with a history of colon cancer with metastases to liver and lung, chronic pain, constipation, benign prostatic hypertrophy, who presents to the hospital due to shortness of breath.   1.  Shortness of breath. This is likely secondary to the patient's extensive lung metastases and also propagated with underlying mild anxiety. There is no evidence of pulmonary embolism or pneumonia. For now we will continue supportive care with O2 supplementation. The patient likely needs to be assessed for home oxygen prior to discharge.  We will also add some as needed Xanax for his anxiety.  2.  Anxiety. As mentioned will start the patient on some as needed Xanax.  3.  Chronic pain. Continue his fentanyl patch.  4.  Constipation. Continue with his MiraLax and Colace.  5.  Recurrent colon cancer with metastases to the lung and liver. I will get an oncology consult. The patient is well-known to Dr. Oliva Bustard.  6.  Benign prostatic hypertrophy. Continue Flomax.   CODE STATUS: The patient is a full code.   TIME SPENT: 45 minutes.      ____________________________ Belia Heman. Verdell Carmine,  MD vjs:bu D: 07/14/2014 18:56:27 ET T: 07/14/2014 20:06:37 ET JOB#: [id number]  cc: Belia Heman. Verdell Carmine, MD, <Dictator> Henreitta Leber MD ELECTRONICALLY SIGNED 08/07/2014 10:56

## 2015-01-21 NOTE — Op Note (Signed)
PATIENT NAME:  Edward Bradley, Edward Bradley MR#:  [id number] DATE OF BIRTH:  [birth year]  DATE OF PROCEDURE:  03/31/2012  PREOPERATIVE DIAGNOSIS: History of rectal cancer with radiation proctitis and rectal stricture.   POSTOPERATIVE DIAGNOSIS: History of rectal cancer with radiation proctitis and rectal stricture.   PROCEDURE: Flexible proctoscopy.   SURGEON: Loreli Dollar, MD  ANESTHESIA: Versed 5 mg, fentanyl 75 mcg IV.   INDICATIONS: This 81 year old male with history of rectal cancer has a diverting loop colostomy and has developed a paracolostomy hernia. Has history of rectal stricture now brought for further evaluation to determine if his colostomy can be closed. This was done under sedation due to previous failed attempt to do barium enema due to pain.   DESCRIPTION OF PROCEDURE: The patient was placed on the stretcher in the left lateral decubitus position and was monitored with pulse oximetry, intermittent blood pressure recordings and cardiac monitor, given oxygen via nasal cannula at a rate of 2 L/min.   External anal appearance was normal. Digital exam demonstrate narrowing of the rectum and it felt as the finger was inserted it was gently dilating the rectum. Next, the flexible Olympus colonoscope was inserted. It went in 10 cm and discovered a severe stricture of the rectum. Pictures were taken. This stricture appeared to be approximately 4 to 5 mm in diameter. There was some surrounding friability of the distal rectum. No polyps or tumors were seen. Bleeding was very scant. Scope was gradually pulled back seeing no polyps or tumors and the scope was removed.       The patient tolerated the procedure satisfactorily, was then prepared for transfer to the recovery room.  ____________________________ Lenna Sciara. Rochel Brome, MD jws:cms D: 03/31/2012 09:56:58 ET T: 03/31/2012 11:17:56 ET JOB#: [id number]  cc: Loreli Dollar, MD, <Dictator> Nickola Major. Lazarus Gowda, MD Loreli Dollar MD ELECTRONICALLY  SIGNED 04/01/2012 12:47

## 2015-09-22 IMAGING — CT NM PET TUM IMG RESTAG (PS) SKULL BASE T - THIGH
10 series · 25 of 25 positions shown · non-contrast
Comparison: PET-CT 01/26/2012

CLINICAL DATA: Subsequent treatment strategy for rectal cancer.

EXAM:
NUCLEAR MEDICINE PET SKULL BASE TO THIGH
TECHNIQUE: 11.8 mCi F-18 FDG was injected intravenously. Full-ring PET imaging
was performed from the skull base to thigh after the radiotracer. CT
data was obtained and used for attenuation correction and anatomic
localization.
FASTING BLOOD GLUCOSE:  Value: 92 mg/dl

[Series 3: ct wb 5.0 b30f · axial · 5.0mm · 0.98mm/px · z∈[-1465,-481]mm · 3 of 329 slices shown]
[im 1/329]
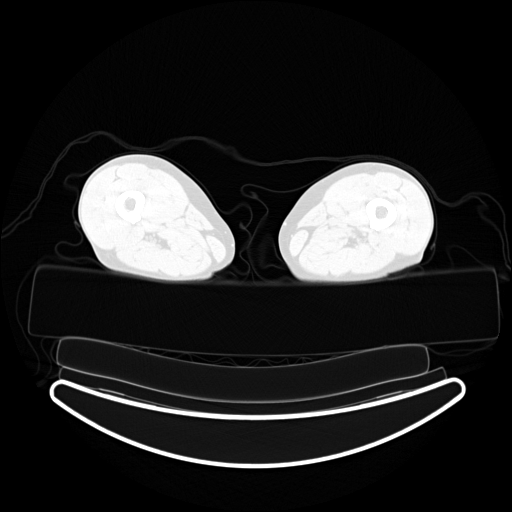
[im 165/329]
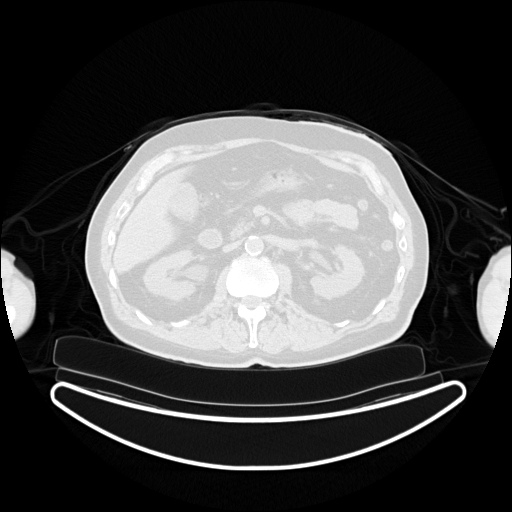
[im 329/329  brain]
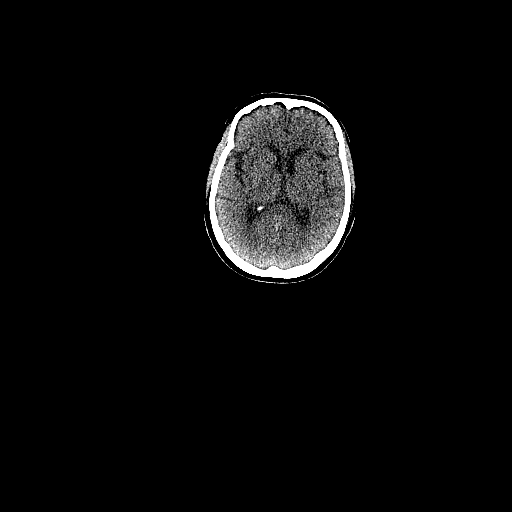

[Series 4: pet wb (ac) · axial · 5.0mm · 4.07mm/px · z∈[-1465,-481]mm · 3 of 329 slices shown]
[im 1/329]
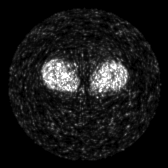
[im 165/329]
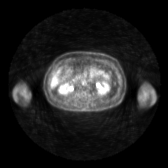
[im 329/329]
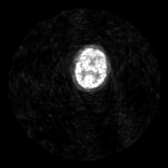

[Series 5: pet wb uncorrected (nac) · axial · 5.0mm · 4.07mm/px · z∈[-1465,-481]mm · 4 of 329 slices shown]
[im 1/329]
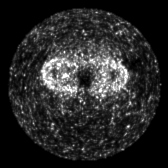
[im 110/329]
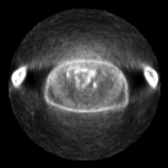
[im 219/329]
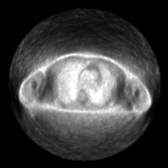
[im 329/329]
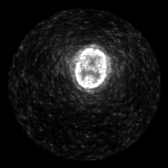

[Series 603: pet/ct fused axial · 4 of 327 slices shown]
[im 1/327]
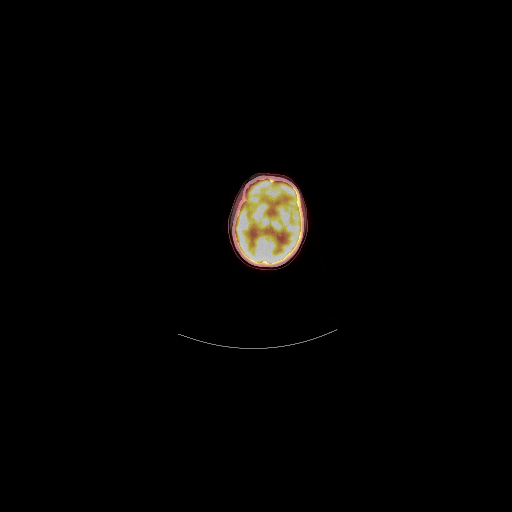
[im 109/327]
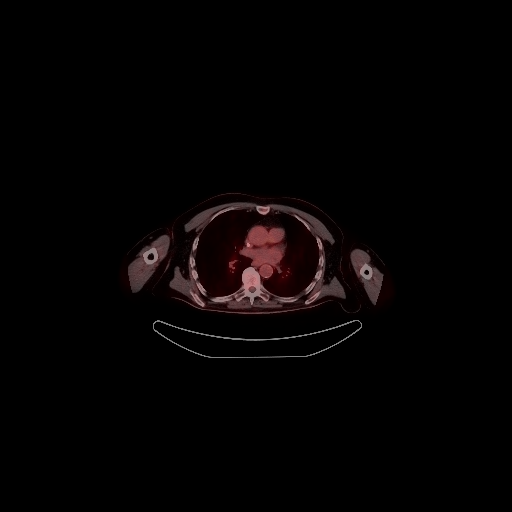
[im 218/327]
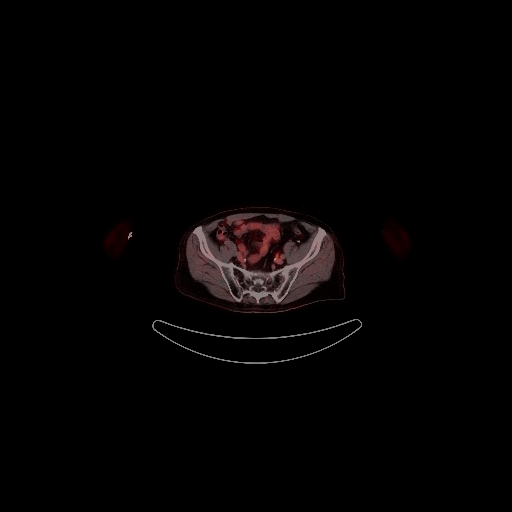
[im 327/327]
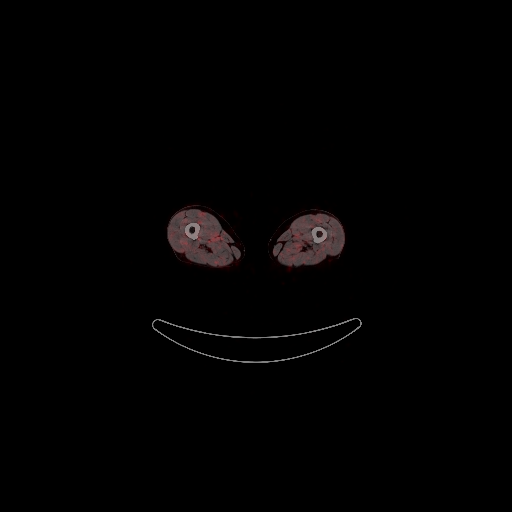

[Series 604: pet/ct fused coronal · 1 of 86 slices shown]
[im 1/86]
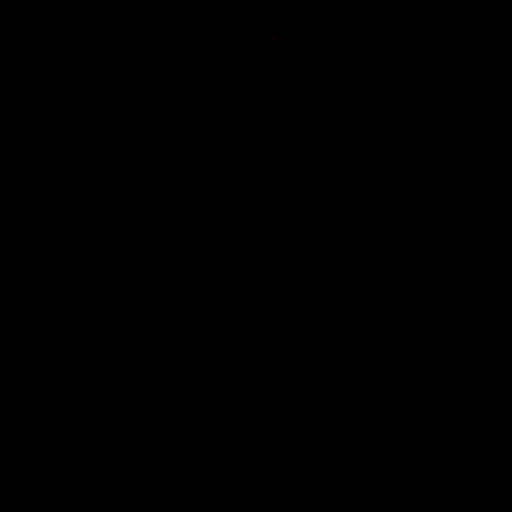

[Series 605: pet/ct fused sagittal · 2 of 171 slices shown]
[im 1/171]
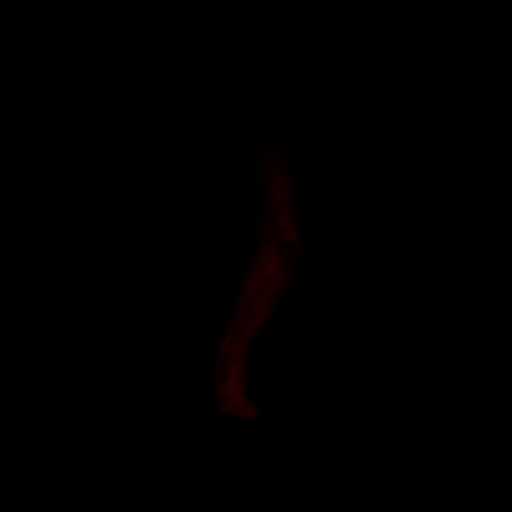
[im 171/171]
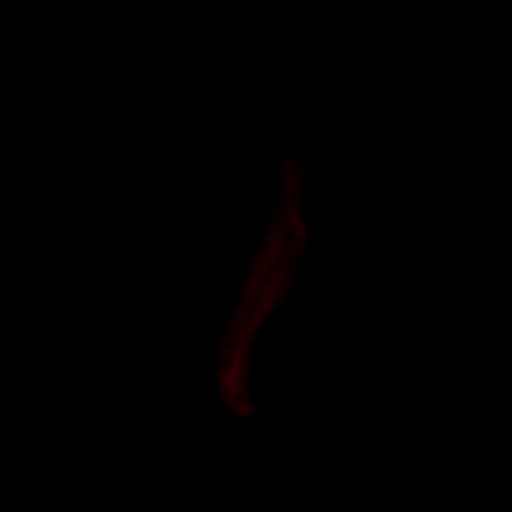

[Series 606: pet axial · 4 of 328 slices shown]
[im 1/328]
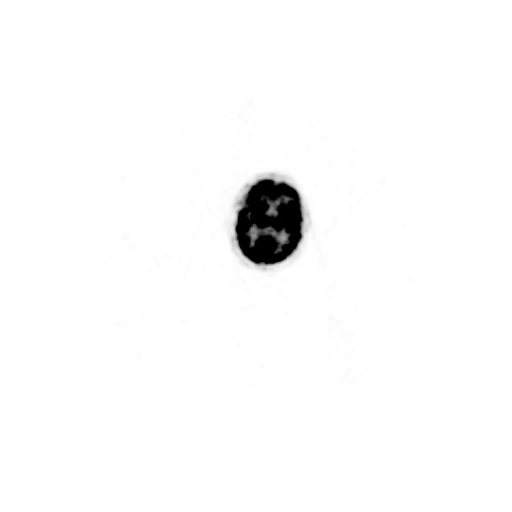
[im 110/328]
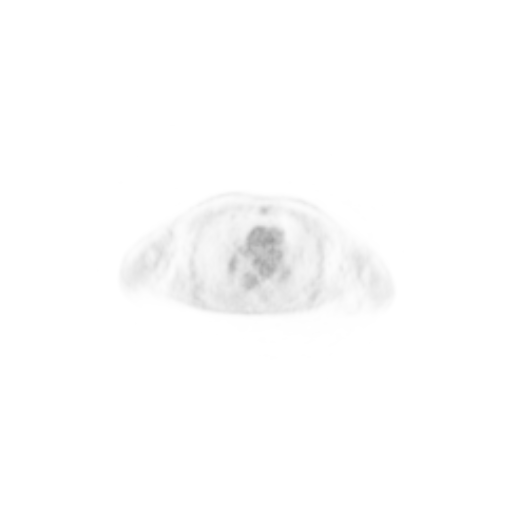
[im 219/328]
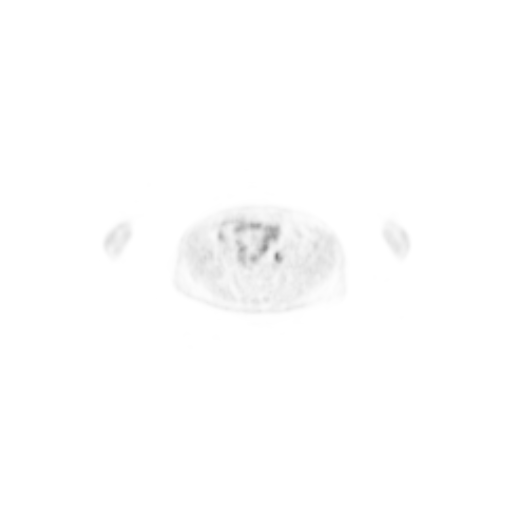
[im 328/328]
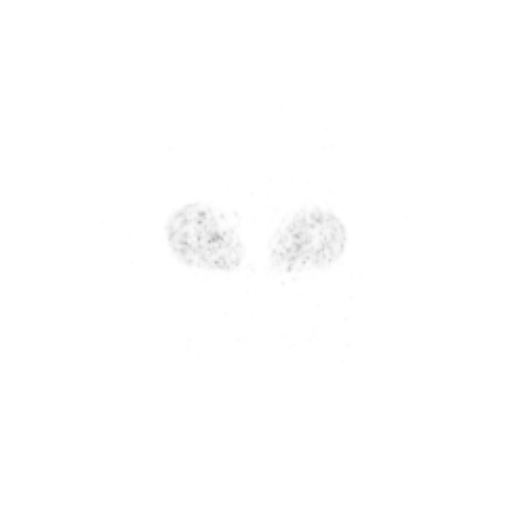

[Series 607: pet coronal · 1 of 105 slices shown]
[im 1/105]
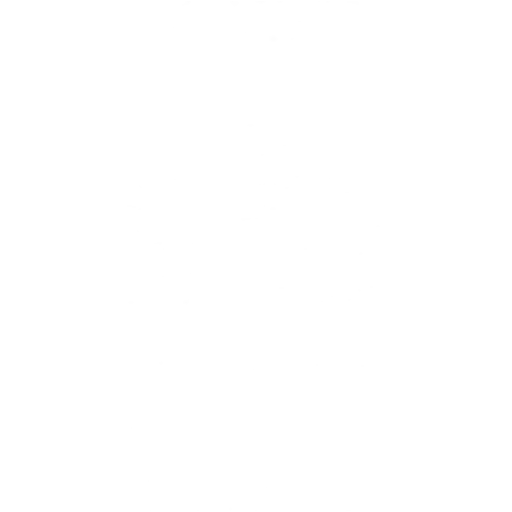

[Series 608: pet sagittal · 2 of 204 slices shown]
[im 1/204]
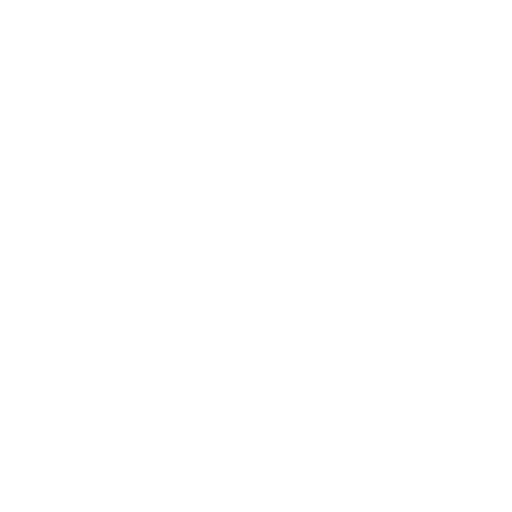
[im 204/204]
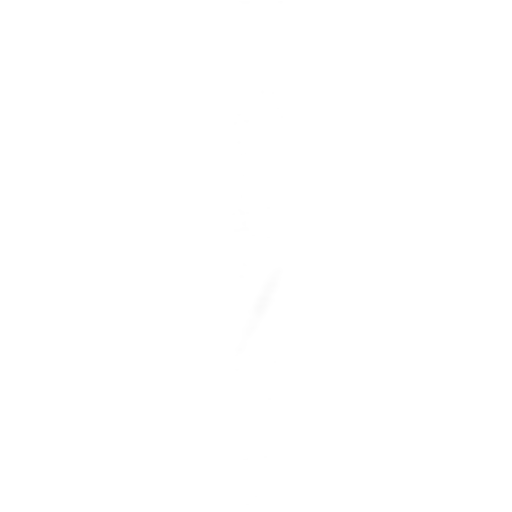

[Series 1096: results mm oncology reading · 1.06mm/px · 1 of 4 slices shown]
[im 1/4]
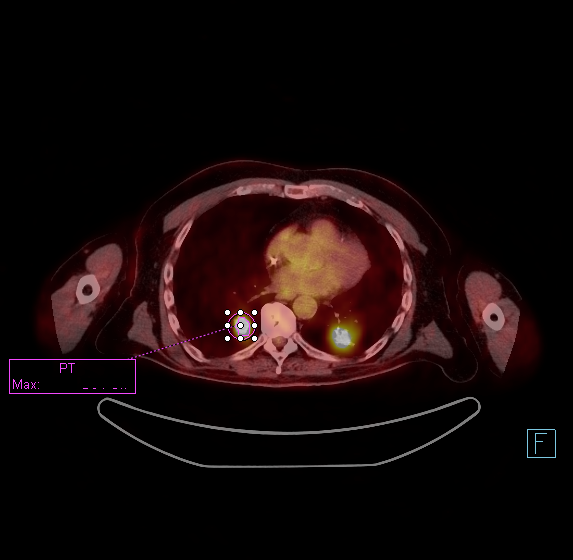

[25 of 25 positions shown; findings below may reference images not displayed]

FINDINGS: NECK

No hypermetabolic lymph nodes in the neck.

CHEST

Progressive metastatic disease involving the chest with new and
enlarging pulmonary nodules. These are hypermetabolic. The largest
lesion in the right lower lobe measures 21 x 19 mm and SUV max is
12.9. The largest lesion in the left lower lobe measures 3.2 x
cm and SUV max is 9.4. Other smaller nodules are hypermetabolic.

There is a small subcarinal lymph node which is hypermetabolic. SUV
max is 5.7. A left hilar lymph node is hypermetabolic with SUV max
of

ABDOMEN/PELVIS

No abnormal hypermetabolic activity within the liver, pancreas,
adrenal glands, or spleen. No hypermetabolic lymph nodes in the
abdomen or pelvis. Stable surgical changes involving the descending
colon sigmoid colon junction region. No evidence of recurrent tumor
or adjacent adenopathy. No obvious hepatic metastatic disease.

SKELETON

No focal hypermetabolic activity to suggest skeletal metastasis.
IMPRESSION: New and progressive metastatic disease involving the chest as
described above.
